# Patient Record
Sex: Male | Born: 1945 | Race: White | Hispanic: No | Marital: Married | Smoking: Never smoker
Health system: Southern US, Community
[De-identification: ages and names within clinical notes are randomized; demographics above are authoritative.]

## PROBLEM LIST (undated history)

## (undated) DIAGNOSIS — H269 Unspecified cataract: Secondary | ICD-10-CM

## (undated) DIAGNOSIS — E785 Hyperlipidemia, unspecified: Secondary | ICD-10-CM

## (undated) DIAGNOSIS — T7840XA Allergy, unspecified, initial encounter: Secondary | ICD-10-CM

## (undated) HISTORY — DX: Unspecified cataract: H26.9

## (undated) HISTORY — PX: OTHER SURGICAL HISTORY: SHX169

## (undated) HISTORY — DX: Hyperlipidemia, unspecified: E78.5

## (undated) HISTORY — DX: Allergy, unspecified, initial encounter: T78.40XA

---

## 2011-11-10 ENCOUNTER — Ambulatory Visit: Payer: Self-pay | Admitting: Internal Medicine

## 2011-12-08 ENCOUNTER — Ambulatory Visit: Payer: Self-pay | Admitting: Internal Medicine

## 2011-12-22 ENCOUNTER — Ambulatory Visit (INDEPENDENT_AMBULATORY_CARE_PROVIDER_SITE_OTHER): Payer: Federal, State, Local not specified - PPO | Admitting: Internal Medicine

## 2011-12-22 DIAGNOSIS — E782 Mixed hyperlipidemia: Secondary | ICD-10-CM

## 2011-12-22 DIAGNOSIS — J301 Allergic rhinitis due to pollen: Secondary | ICD-10-CM

## 2011-12-22 DIAGNOSIS — R05 Cough: Secondary | ICD-10-CM

## 2012-03-08 ENCOUNTER — Ambulatory Visit (INDEPENDENT_AMBULATORY_CARE_PROVIDER_SITE_OTHER): Payer: Federal, State, Local not specified - PPO | Admitting: Internal Medicine

## 2012-03-08 ENCOUNTER — Encounter: Payer: Self-pay | Admitting: Internal Medicine

## 2012-03-08 VITALS — BP 149/86 | HR 63 | Temp 98.9°F | Resp 16 | Ht 70.0 in | Wt 184.0 lb

## 2012-03-08 DIAGNOSIS — R05 Cough: Secondary | ICD-10-CM

## 2012-03-08 DIAGNOSIS — R059 Cough, unspecified: Secondary | ICD-10-CM

## 2012-03-08 DIAGNOSIS — J40 Bronchitis, not specified as acute or chronic: Secondary | ICD-10-CM

## 2012-03-08 DIAGNOSIS — J9801 Acute bronchospasm: Secondary | ICD-10-CM

## 2012-03-08 DIAGNOSIS — J329 Chronic sinusitis, unspecified: Secondary | ICD-10-CM

## 2012-03-08 DIAGNOSIS — Z789 Other specified health status: Secondary | ICD-10-CM | POA: Insufficient documentation

## 2012-03-08 DIAGNOSIS — J301 Allergic rhinitis due to pollen: Secondary | ICD-10-CM

## 2012-03-08 MED ORDER — METHYLPREDNISOLONE ACETATE 80 MG/ML IJ SUSP
80.0000 mg | Freq: Once | INTRAMUSCULAR | Status: AC
  Administered 2012-03-08: 80 mg via INTRAMUSCULAR

## 2012-03-08 MED ORDER — AZITHROMYCIN 500 MG PO TABS: 500.0000 mg | ORAL_TABLET | Freq: Every day | ORAL | Status: AC

## 2012-03-08 NOTE — Progress Notes (Signed)
  Subjective:    Patient ID: Austin Watkins, male    DOB: [birth year], 66 y.o.   MRN: [id number]  HPI Has cough, wheezes mild, allergy congestions. Has bad breath per wife Allergys much better on our rx. Review of Systems     Objective:   Physical Exam Nasal congestion Lungs a few wheezes   PFR 625 normal    Assessment & Plan:  Allergys and mild bronchospasm Sinusitis and bronchitis Depomedrol 120mg  IM Zithromax 500mg  5d

## 2012-03-22 ENCOUNTER — Encounter: Payer: Self-pay | Admitting: Internal Medicine

## 2012-03-29 ENCOUNTER — Ambulatory Visit (INDEPENDENT_AMBULATORY_CARE_PROVIDER_SITE_OTHER): Payer: Federal, State, Local not specified - PPO | Admitting: Internal Medicine

## 2012-03-29 ENCOUNTER — Encounter: Payer: Self-pay | Admitting: Internal Medicine

## 2012-03-29 VITALS — BP 158/86 | HR 60 | Temp 97.2°F | Resp 16 | Ht 69.5 in | Wt 179.2 lb

## 2012-03-29 DIAGNOSIS — E782 Mixed hyperlipidemia: Secondary | ICD-10-CM

## 2012-03-29 DIAGNOSIS — E789 Disorder of lipoprotein metabolism, unspecified: Secondary | ICD-10-CM

## 2012-03-29 DIAGNOSIS — J301 Allergic rhinitis due to pollen: Secondary | ICD-10-CM

## 2012-03-29 DIAGNOSIS — Z0000 Encounter for general adult medical examination without abnormal findings: Secondary | ICD-10-CM

## 2012-03-29 MED ORDER — ATORVASTATIN CALCIUM 10 MG PO TABS: 10.0000 mg | ORAL_TABLET | Freq: Every day | ORAL | Status: DC

## 2012-03-29 NOTE — Patient Instructions (Signed)
Allergic Rhinitis  Allergic rhinitis is when the mucous membranes in the nose respond to allergens. Allergens are particles in the air that cause your body to have an allergic reaction. This causes you to release allergic antibodies. Through a chain of events, these eventually cause you to release histamine into the blood stream (hence the use of antihistamines). Although meant to be protective to the body, it is this release that causes your discomfort, such as frequent sneezing, congestion and an itchy runny nose.    CAUSES    The pollen allergens may come from grasses, trees, and weeds. This is seasonal allergic rhinitis, or "hay fever." Other allergens cause year-round allergic rhinitis (perennial allergic rhinitis) such as house dust mite allergen, pet dander and mold spores.    SYMPTOMS     Nasal stuffiness (congestion).   Runny, itchy nose with sneezing and tearing of the eyes.   There is often an itching of the mouth, eyes and ears.  It cannot be cured, but it can be controlled with medications.  DIAGNOSIS    If you are unable to determine the offending allergen, skin or blood testing may find it.  TREATMENT     Avoid the allergen.   Medications and allergy shots (immunotherapy) can help.   Hay fever may often be treated with antihistamines in pill or nasal spray forms. Antihistamines block the effects of histamine. There are over-the-counter medicines that may help with nasal congestion and swelling around the eyes. Check with your caregiver before taking or giving this medicine.  If the treatment above does not work, there are many new medications your caregiver can prescribe. Stronger medications may be used if initial measures are ineffective. Desensitizing injections can be used if medications and avoidance fails. Desensitization is when a patient is given ongoing shots until the body becomes less sensitive to the allergen. Make sure you follow up with your caregiver if problems continue.  SEEK  MEDICAL CARE IF:     You develop fever (more than 100.5 F (38.1 C).   You develop a cough that does not stop easily (persistent).   You have shortness of breath.   You start wheezing.   Symptoms interfere with normal daily activities.  Document Released: 08/12/2001 Document Revised: 11/06/2011 Document Reviewed: 02/21/2009  [employer] Patient Information 2012 [employer].

## 2012-03-29 NOTE — Progress Notes (Signed)
  Subjective:    Patient ID: Austin Watkins, male    DOB: 1946-03-04, 66 y.o.   MRN: [id number]  HPI allergys and bronchospasm much better Lipids controlled and tolerates med See scanned hx Review of Systems see scanned ROS   Objective:   Physical Exam  Constitutional: He is oriented to person, place, and time. He appears well-developed and well-nourished.  HENT:  Right Ear: External ear normal.  Left Ear: External ear normal.  Nose: Nose normal.  Mouth/Throat: Oropharynx is clear and moist.  Eyes: Conjunctivae and EOM are normal. Pupils are equal, round, and reactive to light. No scleral icterus.  Neck: Neck supple. No JVD present. No tracheal deviation present. No thyromegaly present.  Cardiovascular: Normal rate, regular rhythm, normal heart sounds and intact distal pulses.   Pulmonary/Chest: Breath sounds normal.  Abdominal: Soft. Bowel sounds are normal. He exhibits no mass. There is no tenderness.  Genitourinary: Rectum normal and penis normal.  Musculoskeletal: Normal range of motion.  Lymphadenopathy:    He has no cervical adenopathy.  Neurological: He is alert and oriented to person, place, and time. He has normal reflexes. Coordination normal.  Skin: Skin is warm and dry.  Psychiatric: He has a normal mood and affect.   Tiny nodule on left lobe prostate       Assessment & Plan:  Healthy CPE RF allergy meds 1 year Watch on prostate

## 2012-04-06 ENCOUNTER — Telehealth: Payer: Self-pay

## 2012-04-06 NOTE — Telephone Encounter (Signed)
Wife concerned about abnormal lung findings at patient's recent physical with Dr. Perrin Maltese. How severe was damage? Does he need referred to pulmonary specialist? Also, he had rheumatic fever at age 66, so could that be damage that appears on lungs?  Also, patient got generic Lipitor from pharmacy but has always been on brand RX. Was he supposed to get generic, and will he tolerate it? Hasn't opened package from pharmacy yet. Requests call from Dr. Perrin Maltese or nurse. OK to leave message on voicemail if she can't answer.

## 2012-04-12 ENCOUNTER — Telehealth: Payer: Self-pay

## 2012-04-12 MED ORDER — LIPITOR 10 MG PO TABS: 10.0000 mg | ORAL_TABLET | Freq: Every day | ORAL | Status: DC

## 2012-04-12 NOTE — Telephone Encounter (Signed)
Patient's wife is very concerned about her husband, she left this message on 5/7 and not received a call back from [place]. I am re-routing msg to the clinical pool to try to assist her.

## 2012-04-12 NOTE — Telephone Encounter (Signed)
Spoke w/wife and apologized that she had not received a call earlier, but that Dr Perrin Maltese has not been in office. He will be back on Thurs and will review ?s about lung damage at that time. Assured her that if damage was very severe that Dr Perrin Maltese would have discussed sending pt to pulmonologist at the visit. Wife requests that we send Lipitor Rx for name brand only to pharm since it is working well for pt and they have not had a problem w/ins paying for it. I am changing Rx to Name Brand and sending to pharm. Dr Perrin Maltese, do you want to call pt or give me message for pt on her other ?s

## 2013-03-28 ENCOUNTER — Ambulatory Visit (INDEPENDENT_AMBULATORY_CARE_PROVIDER_SITE_OTHER): Payer: Federal, State, Local not specified - PPO | Admitting: Internal Medicine

## 2013-03-28 ENCOUNTER — Encounter: Payer: Self-pay | Admitting: Internal Medicine

## 2013-03-28 VITALS — BP 140/90 | HR 62 | Temp 98.1°F | Resp 16 | Ht 69.5 in | Wt 186.0 lb

## 2013-03-28 DIAGNOSIS — Z125 Encounter for screening for malignant neoplasm of prostate: Secondary | ICD-10-CM

## 2013-03-28 DIAGNOSIS — Z79899 Other long term (current) drug therapy: Secondary | ICD-10-CM

## 2013-03-28 DIAGNOSIS — J309 Allergic rhinitis, unspecified: Secondary | ICD-10-CM

## 2013-03-28 DIAGNOSIS — E785 Hyperlipidemia, unspecified: Secondary | ICD-10-CM

## 2013-03-28 DIAGNOSIS — J9801 Acute bronchospasm: Secondary | ICD-10-CM

## 2013-03-28 LAB — COMPREHENSIVE METABOLIC PANEL
ALT: 20 U/L (ref 0–53)
Albumin: 4.5 g/dL (ref 3.5–5.2)
BUN: 18 mg/dL (ref 6–23)
CO2: 29 mEq/L (ref 19–32)
Calcium: 9.5 mg/dL (ref 8.4–10.5)
Chloride: 104 mEq/L (ref 96–112)
Creat: 0.92 mg/dL (ref 0.50–1.35)
Potassium: 4.5 mEq/L (ref 3.5–5.3)

## 2013-03-28 LAB — CBC WITH DIFFERENTIAL/PLATELET
Basophils Absolute: 0 10*3/uL (ref 0.0–0.1)
HCT: 44.8 % (ref 39.0–52.0)
Hemoglobin: 15.6 g/dL (ref 13.0–17.0)
Lymphocytes Relative: 38 % (ref 12–46)
Monocytes Absolute: 0.7 10*3/uL (ref 0.1–1.0)
Neutro Abs: 3.8 10*3/uL (ref 1.7–7.7)
Neutrophils Relative %: 51 % (ref 43–77)
RDW: 13.5 % (ref 11.5–15.5)
WBC: 7.4 10*3/uL (ref 4.0–10.5)

## 2013-03-28 LAB — TSH: TSH: 2.021 u[IU]/mL (ref 0.350–4.500)

## 2013-03-28 LAB — LIPID PANEL
HDL: 52 mg/dL (ref >39)
LDL Cholesterol: 87 mg/dL (ref 0–99)
VLDL: 26 mg/dL (ref 0–40)

## 2013-03-28 MED ORDER — ATORVASTATIN CALCIUM 10 MG PO TABS: 10.0000 mg | ORAL_TABLET | Freq: Every day | ORAL | Status: DC

## 2013-03-28 NOTE — Patient Instructions (Signed)
Immunization Schedule, Adult  Influenza vaccine.  Adults should be given 1 dose every year.  Tetanus, diphtheria, and pertussis (Td, Tdap) vaccine.  Adults who have not previously been given Tdap or who do not know their vaccine status should be given 1 dose of Tdap.  Adults should have a Td booster every 10 years.  Doses should be given if needed to catch up on missed doses in the past.  Pregnant women should be given 1 dose of Tdap vaccine during each pregnancy.  Varicella vaccine.  All adults without evidence of immunity to varicella should receive 2 doses or a second dose if they have received only 1 dose.  Pregnant women who do not have evidence of immunity should be given the first dose after their pregnancy.  Human papillomavirus (HPV) vaccine.  Women aged 43 through 26 years who have not been given the vaccine previously should be given the 3 dose series. The second dose should be given 1 to 2 months after the first dose. The third dose should be given at least 24 weeks after the first dose.  The vaccine is not recommended for use in pregnant women. However, pregnancy testing is not needed before being given a dose. If a woman is found to be pregnant after being given a dose, no treatment is needed. In that case, the remaining doses should be delayed until after the pregnancy.  Men aged 9 through 21 years who have not been given the vaccine previously should be given the 3 dose series. Men aged 26 through 62 years may be given the 3 dose series. The second dose should be given 1 to 2 months after the first dose. The third dose should be given at least 24 weeks after the first dose.  Zoster vaccine.  One dose is recommended for adults aged 50 years and older unless certain conditions are present.  Measles, mumps, and rubella (MMR) vaccine.  Adults born before 3 generally are considered immune to measles and mumps. Healthcare workers born before 1957 who do not have  evidence of immunity should consider vaccination.  Adults born in [birth year] or later should have 1 or more doses of MMR vaccine unless there is a contraindication for the vaccine or they have evidence of immunity to the diseases. A second dose should be given at least 28 days after the first dose. Adults receiving certain types of previous vaccines should consider or be given vaccine doses.  For women of childbearing age, rubella immunity should be determined. If there is no evidence of immunity, women who are not pregnant should be vaccinated. If there is no evidence of immunity, women who are pregnant should delay vaccination until after their pregnancy.  Pneumococcal polysaccharide (PPSV23) vaccine.  All adults aged 3 years and older should be given 1 dose.  Adults younger than age 64 years who have certain medical conditions, who smoke cigarettes, who reside in nursing homes or long-term care facilities, or who have an unknown vaccination history should usually be given 1 or 2 doses of the vaccine.  Pneumococcal 13-valent conjugate (PVC13) vaccine.  Adults aged 46 years or older with certain medical conditions and an unknown or incomplete pneumococcal vaccination history should usually be given 1 dose of the vaccine. This dose may be in addition to a PPSV23 vaccine dose.  Meningococcal vaccine.  First-year college students up to age 57 years who are living in residence halls should be given a dose if they did not receive a dose on  or after their 16th birthday.  A dose should be given to microbiologists working with certain meningitis bacteria, military recruits, and people who travel to or live in countries with a high rate of meningitis.  One or 2 doses should be given to adults who have certain high-risk conditions.  Hepatitis A vaccine.  Adults who wish to be protected from this disease, who have certain high-risk conditions, who work with hepatitis A-infected animals, who work in  hepatitis A research labs, or who travel to or work in countries with a high rate of hepatitis A should be given the 2 dose series of the vaccine.  Adults who were previously unvaccinated and who anticipate close contact with an international adoptee during the first 60 days after arrival in the [place] from a country with a high rate of hepatitis A should be given the vaccine. The first dose of the 2 dose series should be given 2 or more weeks before the arrival of the adoptee.  Hepatitis B vaccine.  Adults who wish to be protected from this disease, who have certain high-risk conditions, who may be exposed to blood or other infectious body fluids, who are household contacts or sex partners of hepatitis B positive people, who are clients or workers in certain care facilities, or who travel to or work in countries with a high rate of hepatitis B should be given the 3 dose series of the vaccine. If you travel outside the [place], additional vaccines may be needed. The Centers for Disease Control and Prevention (CDC) provides information about the vaccines, medicines, and other measures necessary to prevent illness and injury during international travel. Visit the CDC website at http://www.church.org/ or call (800) CDC-INFO [(660) 608-7489]. You may also consult a travel clinic or your caregiver. Document Released: 02/07/2004 Document Revised: 02/09/2012 Document Reviewed: 01/02/2012 [facility] Patient Information 2013 [place], [place].

## 2013-03-28 NOTE — Progress Notes (Signed)
  Subjective:    Patient ID: Austin Watkins, male    DOB: 1946/08/05, 67 y.o.   MRN: [id number]  HPI Refuses immunizations today, offered shingles and pneumovax. Doing well on atoravastatin.   Review of Systems cpe August    Objective:   Physical Exam  Vitals reviewed. Constitutional: He is oriented to person, place, and time. He appears well-developed and well-nourished.  Cardiovascular: Normal rate, regular rhythm and normal heart sounds.   Pulmonary/Chest: Effort normal and breath sounds normal.  Neurological: He is alert and oriented to person, place, and time.  Psychiatric: He has a normal mood and affect.   138/88       Assessment & Plan:  RF atoravastatin

## 2013-04-04 ENCOUNTER — Encounter: Payer: Federal, State, Local not specified - PPO | Admitting: Internal Medicine

## 2013-07-18 ENCOUNTER — Ambulatory Visit (INDEPENDENT_AMBULATORY_CARE_PROVIDER_SITE_OTHER): Payer: Federal, State, Local not specified - PPO | Admitting: Internal Medicine

## 2013-07-18 ENCOUNTER — Encounter: Payer: Self-pay | Admitting: Internal Medicine

## 2013-07-18 VITALS — BP 132/88 | HR 62 | Temp 98.8°F | Resp 16 | Ht 69.0 in | Wt 182.0 lb

## 2013-07-18 DIAGNOSIS — Z139 Encounter for screening, unspecified: Secondary | ICD-10-CM

## 2013-07-18 DIAGNOSIS — E785 Hyperlipidemia, unspecified: Secondary | ICD-10-CM

## 2013-07-18 DIAGNOSIS — Z79899 Other long term (current) drug therapy: Secondary | ICD-10-CM

## 2013-07-18 DIAGNOSIS — Z0000 Encounter for general adult medical examination without abnormal findings: Secondary | ICD-10-CM

## 2013-07-18 LAB — POCT URINALYSIS DIPSTICK
Bilirubin, UA: NEGATIVE
Glucose, UA: NEGATIVE
Ketones, UA: NEGATIVE
Leukocytes, UA: NEGATIVE
Nitrite, UA: NEGATIVE

## 2013-07-18 LAB — POCT UA - MICROSCOPIC ONLY: Yeast, UA: NEGATIVE

## 2013-07-18 LAB — IFOBT (OCCULT BLOOD): IFOBT: NEGATIVE

## 2013-07-18 NOTE — Progress Notes (Signed)
  Subjective:    Patient ID: Austin Watkins, male    DOB: [birth year], 67 y.o.   MRN: [id number]  HPI Refuses all vaccines except flu shot. Offered again. Lipids are great, he feels good and has no problems. Aging parents are his biggest worry.   Review of Systems All neg every system    Objective:   Physical Exam  Vitals reviewed. Constitutional: He is oriented to person, place, and time. He appears well-developed and well-nourished.  HENT:  Right Ear: External ear normal.  Left Ear: External ear normal.  Nose: Nose normal.  Mouth/Throat: Oropharynx is clear and moist.  Eyes: Conjunctivae and EOM are normal. Pupils are equal, round, and reactive to light.  Neck: Normal range of motion. Neck supple. No tracheal deviation present. No thyromegaly present.  Cardiovascular: Normal rate, regular rhythm, normal heart sounds and intact distal pulses.   Pulmonary/Chest: Effort normal and breath sounds normal.  Abdominal: Soft. Bowel sounds are normal. He exhibits no mass. There is no tenderness.  Genitourinary: Rectum normal, prostate normal and penis normal.  Musculoskeletal: Normal range of motion.  Neurological: He is alert and oriented to person, place, and time. He has normal reflexes. No cranial nerve deficit. He exhibits normal muscle tone. Coordination normal.  Skin: Skin is warm and dry.  Psychiatric: He has a normal mood and affect. His behavior is normal. Judgment and thought content normal.   Results for orders placed in visit on 07/18/13  POCT UA - MICROSCOPIC ONLY      Result Value Range   WBC, Ur, HPF, POC 0-1     RBC, urine, microscopic 0-4     Bacteria, U Microscopic neg     Mucus, UA trace     Epithelial cells, urine per micros 0-2     Crystals, Ur, HPF, POC neg     Casts, Ur, LPF, POC neg     Yeast, UA neg    POCT URINALYSIS DIPSTICK      Result Value Range   Color, UA yellow     Clarity, UA clear     Glucose, UA neg     Bilirubin, UA neg     Ketones, UA neg      Spec Grav, UA 1.020     Blood, UA trace     pH, UA 5.5     Protein, UA neg     Urobilinogen, UA 0.2     Nitrite, UA neg     Leukocytes, UA Negative            Assessment & Plan:  RF meds 1 year.

## 2013-07-18 NOTE — Patient Instructions (Signed)
Cholesterol Cholesterol is a white, waxy, fat-like protein needed by your body in small amounts. The liver makes all the cholesterol you need. It is carried from the liver by the blood through the blood vessels. Deposits (plaque) may build up on blood vessel walls. This makes the arteries narrower and stiffer. Plaque increases the risk for heart attack and stroke. You cannot feel your cholesterol level even if it is very high. The only way to know is by a blood test to check your lipid (fats) levels. Once you know your cholesterol levels, you should keep a record of the test results. Work with your caregiver to to keep your levels in the desired range. WHAT THE RESULTS MEAN:  Total cholesterol is a rough measure of all the cholesterol in your blood.  LDL is the so-called bad cholesterol. This is the type that deposits cholesterol in the walls of the arteries. You want this level to be low.  HDL is the good cholesterol because it cleans the arteries and carries the LDL away. You want this level to be high.  Triglycerides are fat that the body can either burn for energy or store. High levels are closely linked to heart disease. DESIRED LEVELS:  Total cholesterol below 200.  LDL below 100 for people at risk, below 70 for very high risk.  HDL above 50 is good, above 60 is best.  Triglycerides below 150. HOW TO LOWER YOUR CHOLESTEROL:  Diet.  Choose fish or white meat chicken and [place], roasted or baked. Limit fatty cuts of red meat, fried foods, and processed meats, such as sausage and lunch meat.  Eat lots of fresh fruits and vegetables. Choose whole grains, beans, pasta, potatoes and cereals.  Use only small amounts of olive, corn or canola oils. Avoid butter, mayonnaise, shortening or palm kernel oils. Avoid foods with trans-fats.  Use skim/nonfat milk and low-fat/nonfat yogurt and cheeses. Avoid whole milk, cream, ice cream, egg yolks and cheeses. Healthy desserts include angel food  cake, ginger snaps, animal crackers, hard candy, popsicles, and low-fat/nonfat frozen yogurt. Avoid pastries, cakes, pies and cookies.  Exercise.  A regular program helps decrease LDL and raises HDL.  Helps with weight control.  Do things that increase your activity level like gardening, walking, or taking the stairs.  Medication.  May be prescribed by your caregiver to help lowering cholesterol and the risk for heart disease.  You may need medicine even if your levels are normal if you have several risk factors. HOME CARE INSTRUCTIONS   Follow your diet and exercise programs as suggested by your caregiver.  Take medications as directed.  Have blood work done when your caregiver feels it is necessary. MAKE SURE YOU:   Understand these instructions.  Will watch your condition.  Will get help right away if you are not doing well or get worse. Document Released: 08/12/2001 Document Revised: 02/09/2012 Document Reviewed: 02/02/2008 [facility] Patient Information 2014 [place], [place]. Bronchospasm, Adult Bronchospasm means that there is a spasm or tightening of the airways going into the lungs. Because the airways go into a spasm and get smaller it makes breathing more difficult. For reasons not completely known, workings (functions) of the airways designed to protect the lungs become over active. This causes the airways to become more sensitive to:  Infection.  Weather.  Exercise.  Irritants.  Things that cause allergic reactions or allergies (allergens). Frequent coughing or respiratory episodes should be checked for the cause. This condition may be made worse by exercise.  CAUSES  Inflammation is often the cause of this condition. Allergy, viral respiratory infections, or irritants in the air often cause this problem. Allergic reactions produce immediate and delayed responses. Late reactions may produce more serious inflammation. This may lead to increased reactivity of the  airways. Sometimes this is inherited. Some common triggers are:  Allergies.  Infection commonly triggers attacks. Antibiotics are not helpful for viral infections and usually do not help with attacks of bronchospasm.  Exercise (running, etc.) can trigger an attack. Proper pre-exercise medications help most individuals participate in sports. Swimming is the least likely sport to cause problems.  Irritants (for example, pollution, cigarette smoke, strong odors, aerosol sprays, paint fumes, etc.) may trigger attacks. You cannot smoke and do not allow smoking in your home. This is absolutely necessary. Show this instruction to mates, relatives and significant others that may not agree with you.  Weather changes may cause lung problems but moving around trying to find an ideal climate does not seem to be overly helpful. Winds increase molds and pollens in the air. Rain refreshes the air by washing irritants out. Cold air may cause irritation.  Emotional problems do not cause lung problems but can trigger attacks. SYMPTOMS  Wheezing is the most common symptom. Frequent coughing (with or without exercise and or crying) and repeated respiratory infections are all early warning signs of bronchospasm. Chest tightness and shortness of breath are other symptoms. DIAGNOSIS  Early hidden bronchospasm may go for long periods of time without being detected. This is especially true if wheezing cannot be detected by your caregiver. Lung (pulmonary) function studies may help with diagnosis in these cases. HOME CARE INSTRUCTIONS   It is necessary to remain calm during an attack. Try to relax and breathe more slowly. During this time medications may be given. If any breathing problems seem to be getting worse and are unresponsive to treatment seek immediate medical care.  If you have severe breathing difficulty or have had a life threatening attack it is probably a good idea for you to learn how to give adrenaline  (epi-pen) or use an anaphylaxis kit. Your caregiver can help you with this. These are the same kits carried by people who have severe allergic reactions. This is especially important if you do not have readily accessible medical care.  With any severe breathing problems where epinephrine (adrenaline) has been given at home call 911 immediately as the delayed reaction may be even more severe. SEEK MEDICAL CARE IF:   There is wheezing and shortness of breath, even if medications are given to prevent attacks.  An oral temperature above 102 F (38.9 C) develops.  There are muscle aches, chest pain, or thickening of sputum.  The sputum changes from clear or white to yellow, green, gray, or bloody.  There are problems that may be related to the medicine you are given, such as a rash, itching, swelling, or trouble breathing. SEEK IMMEDIATE MEDICAL CARE IF:   The usual medicines do not stop your wheezing, or there is increased coughing.  You have increased difficulty breathing. MAKE SURE YOU:   Understand these instructions.  Will watch your condition.  Will get help right away if you are not doing well or get worse. Document Released: 11/20/2003 Document Revised: 02/09/2012 Document Reviewed: 07/05/2008 [facility] Patient Information 2014 [place], [place].

## 2013-07-18 NOTE — Progress Notes (Signed)
  Subjective:    Patient ID: Austin Watkins, male    DOB: 08-04-1946, 67 y.o.   MRN: [id number]  HPI    Review of Systems  Constitutional: Negative.   HENT: Negative.   Eyes: Negative.   Respiratory: Negative.   Cardiovascular: Negative.   Gastrointestinal: Negative.   Endocrine: Negative.   Genitourinary: Negative.   Musculoskeletal: Negative.   Skin: Negative.   Allergic/Immunologic: Negative.   Neurological: Negative.   Hematological: Negative.   Psychiatric/Behavioral: Negative.        Objective:   Physical Exam        Assessment & Plan:

## 2014-03-13 ENCOUNTER — Ambulatory Visit (INDEPENDENT_AMBULATORY_CARE_PROVIDER_SITE_OTHER): Payer: Federal, State, Local not specified - PPO | Admitting: Family Medicine

## 2014-03-13 ENCOUNTER — Encounter: Payer: Self-pay | Admitting: Family Medicine

## 2014-03-13 ENCOUNTER — Encounter: Payer: Federal, State, Local not specified - PPO | Admitting: Family Medicine

## 2014-03-13 VITALS — BP 140/80 | HR 69 | Temp 98.7°F | Resp 16 | Ht 69.5 in | Wt 185.6 lb

## 2014-03-13 DIAGNOSIS — E7800 Pure hypercholesterolemia, unspecified: Secondary | ICD-10-CM

## 2014-03-13 DIAGNOSIS — J309 Allergic rhinitis, unspecified: Secondary | ICD-10-CM

## 2014-03-13 DIAGNOSIS — Z79899 Other long term (current) drug therapy: Secondary | ICD-10-CM

## 2014-03-13 DIAGNOSIS — E785 Hyperlipidemia, unspecified: Secondary | ICD-10-CM

## 2014-03-13 MED ORDER — ATORVASTATIN CALCIUM 10 MG PO TABS: 10.0000 mg | ORAL_TABLET | Freq: Every day | ORAL | Status: DC

## 2014-03-13 NOTE — Patient Instructions (Signed)
Same dose of lipitor at this point. You should receive a call or letter about your lab results within the next week to 10 days.  claritin or flonase nasal spray if needed for allergies. Return to the clinic or go to the nearest emergency room if any of your symptoms worsen or new symptoms occur.

## 2014-03-13 NOTE — Progress Notes (Signed)
Subjective:    Patient ID: Austin Watkins, male    DOB: 04/04/1946, 68 y.o.   MRN: [id number]  This chart was scribed for Wendie Agreste, MD by Maree Erie, ED Scribe.  Chief Complaint  Patient presents with  . Hyperlipidemia    bloodwork this am for Chol  . Medication Refill    Atorvastatin 10 mg    PCP: Kennon Portela, MD   HPI  Austin Watkins is a 68 y.o. male who presents to office as a new patient to me. Former patient of Dr. Elder Cyphers, last seen August 2014.   Hyperlipidemia: Here for refill of medications for hyperlipidemia. Takes Lipitor 10 mg QD. He denies side effects, including myalgias, abdominal pain, chest pain or palpitations. He has been on the medication "for years." He denies a history of MI or stent placement. He denies a family history of heart disease. He does not smoke. He has not been walking for exercise as much lately. He reports baseline shortness of breath with walking up stairs that he attributes to not exercising. He is able to mow the lawn and do yard work without difficulty. He also reports a history of rheumatic fever at a young age.   Last lipid panel in 03/2013, seen below: Lab Results  Component Value Date   CHOL 165 03/28/2013   HDL 52 03/28/2013   LDLCALC 87 03/28/2013   TRIG 132 03/28/2013   CHOLHDL 3.2 03/28/2013   Last CMP, normal at that time.   Blood Pressure: He states that he checks his BP at home and its usually in the 761P systolic. He states he usually has "white coat hypertension" and his BP always goes up in the office.   Seasonal Allergies: His allergies have flared up for the season but he has an inhaler and nasal spray for use as needed. He is taking an OTC antihistamine medication for the allergies every four hours.   Patient Active Problem List   Diagnosis Date Noted  . Lipid disorder 03/29/2012  . Allergy history unknown 03/08/2012  . Bronchospasm 03/08/2012   Past Medical History  Diagnosis Date  . Allergy   .  Hyperlipidemia    Past Surgical History  Procedure Laterality Date  . Knee scoped     Allergies  Allergen Reactions  . Neosporin [Neomycin-Bacitracin Zn-Polymyx] Other (See Comments)    Triple antibiotic - rash on skin   Prior to Admission medications   Medication Sig Start Date End Date Taking? Authorizing Provider  aspirin 81 MG tablet Take 81 mg by mouth daily.   Yes Historical Provider, MD  atorvastatin (LIPITOR) 10 MG tablet Take 1 tablet (10 mg total) by mouth daily. 03/28/13  Yes Orma Flaming, MD  OVER THE COUNTER MEDICATION OTC [facility] taking 1 tab in the am/1 at night for cough and drainage   Yes Historical Provider, MD  albuterol (PROVENTIL HFA;VENTOLIN HFA) 108 (90 BASE) MCG/ACT inhaler Inhale 2 puffs into the lungs every 6 (six) hours as needed.    Historical Provider, MD  cetirizine (ZYRTEC) 10 MG tablet Take 10 mg by mouth daily.    Historical Provider, MD  fluticasone (FLONASE) 50 MCG/ACT nasal spray Place 2 sprays into the nose daily.    Historical Provider, MD   History   Social History  . Marital Status: Married    Spouse Name: N/A    Number of Children: N/A  . Years of Education: N/A   Occupational History  . Not on  file.   Social History Main Topics  . Smoking status: Never Smoker   . Smokeless tobacco: Not on file  . Alcohol Use: No  . Drug Use: No  . Sexual Activity: Not on file   Other Topics Concern  . Not on file   Social History Narrative  . No narrative on file     Review of Systems  Constitutional: Negative for fatigue and unexpected weight change.  Eyes: Negative for visual disturbance.  Respiratory: Positive for shortness of breath. Negative for cough and chest tightness.   Cardiovascular: Negative for chest pain, palpitations and leg swelling.  Gastrointestinal: Negative for abdominal pain and blood in stool.  Musculoskeletal: Negative for myalgias.  Neurological: Negative for dizziness, light-headedness and headaches.         Objective:   Physical Exam  Nursing note and vitals reviewed. Constitutional: He is oriented to person, place, and time. He appears well-developed and well-nourished.  HENT:  Head: Normocephalic and atraumatic.  Eyes: EOM are normal. Pupils are equal, round, and reactive to light.  Neck: No JVD present. Carotid bruit is not present.  Cardiovascular: Normal rate, regular rhythm and normal heart sounds.   No murmur heard. Pulmonary/Chest: Effort normal and breath sounds normal. He has no rales.  Musculoskeletal: He exhibits no edema.  Neurological: He is alert and oriented to person, place, and time.  Skin: Skin is warm and dry.  Psychiatric: He has a normal mood and affect.    Filed Vitals:   03/13/14 1452  BP: 140/80  Pulse: 69  Temp: 98.7 F (37.1 C)  TempSrc: Oral  Resp: 16  Height: 5' 9.5" (1.765 m)  Weight: 185 lb 9.6 oz (84.188 kg)  SpO2: 99%         Assessment & Plan:   KATHLEEN LIKINS is a 68 y.o. male  Other and unspecified hyperlipidemia - Plan: atorvastatin (LIPITOR) 10 MG tablet, COMPLETE METABOLIC PANEL WITH GFR, Lipid panel - refilled at same dose, at moderate dose statin per NCEP guidelines. Lipids, cmp pending.   Allergic rhinitis - has zyrtec, flonase if needed.   Meds ordered this encounter  Medications  . OVER THE COUNTER MEDICATION    Sig: OTC Wal-Finite taking 1 tab in the am/1 at night for cough and drainage  . atorvastatin (LIPITOR) 10 MG tablet    Sig: Take 1 tablet (10 mg total) by mouth daily.    Dispense:  90 tablet    Refill:  1   Patient Instructions  Same dose of lipitor at this point. You should receive a call or letter about your lab results within the next week to 10 days.  claritin or flonase nasal spray if needed for allergies. Return to the clinic or go to the nearest emergency room if any of your symptoms worsen or new symptoms occur.   I personally performed the services described in this documentation, which was scribed in  my presence. The recorded information has been reviewed and considered, and addended by me as needed.

## 2014-03-14 LAB — COMPLETE METABOLIC PANEL WITH GFR
ALBUMIN: 4.3 g/dL (ref 3.5–5.2)
ALK PHOS: 61 U/L (ref 39–117)
ALT: 21 U/L (ref 0–53)
AST: 20 U/L (ref 0–37)
BILIRUBIN TOTAL: 0.9 mg/dL (ref 0.2–1.2)
BUN: 17 mg/dL (ref 6–23)
CO2: 28 mEq/L (ref 19–32)
CREATININE: 0.81 mg/dL (ref 0.50–1.35)
Calcium: 8.9 mg/dL (ref 8.4–10.5)
Chloride: 103 mEq/L (ref 96–112)
GFR, Est African American: >89 mL/min
GLUCOSE: 84 mg/dL (ref 70–99)
Potassium: 4.4 mEq/L (ref 3.5–5.3)
Sodium: 138 mEq/L (ref 135–145)
Total Protein: 6.6 g/dL (ref 6.0–8.3)

## 2014-03-14 LAB — LIPID PANEL
CHOL/HDL RATIO: 3.3 ratio
CHOLESTEROL: 154 mg/dL (ref 0–200)
HDL: 46 mg/dL (ref >39)
LDL Cholesterol: 91 mg/dL (ref 0–99)
TRIGLYCERIDES: 85 mg/dL (ref <150)
VLDL: 17 mg/dL (ref 0–40)

## 2014-04-18 ENCOUNTER — Emergency Department
Admission: EM | Admit: 2014-04-18 | Discharge: 2014-04-18 | Disposition: A | Discharge status: Home / Self Care | Payer: BC Managed Care – PPO | Source: Home / Self Care | Attending: Emergency Medicine | Admitting: Emergency Medicine

## 2014-04-18 ENCOUNTER — Ambulatory Visit (INDEPENDENT_AMBULATORY_CARE_PROVIDER_SITE_OTHER): Payer: BC Managed Care – PPO | Admitting: Sports Medicine

## 2014-04-18 ENCOUNTER — Emergency Department (INDEPENDENT_AMBULATORY_CARE_PROVIDER_SITE_OTHER): Payer: BC Managed Care – PPO

## 2014-04-18 ENCOUNTER — Encounter: Payer: Self-pay | Admitting: Emergency Medicine

## 2014-04-18 DIAGNOSIS — S92919A Unspecified fracture of unspecified toe(s), initial encounter for closed fracture: Secondary | ICD-10-CM

## 2014-04-18 DIAGNOSIS — S92911A Unspecified fracture of right toe(s), initial encounter for closed fracture: Secondary | ICD-10-CM | POA: Insufficient documentation

## 2014-04-18 DIAGNOSIS — M7989 Other specified soft tissue disorders: Secondary | ICD-10-CM

## 2014-04-18 DIAGNOSIS — S92351A Displaced fracture of fifth metatarsal bone, right foot, initial encounter for closed fracture: Secondary | ICD-10-CM

## 2014-04-18 DIAGNOSIS — X58XXXA Exposure to other specified factors, initial encounter: Secondary | ICD-10-CM

## 2014-04-18 DIAGNOSIS — S92309A Fracture of unspecified metatarsal bone(s), unspecified foot, initial encounter for closed fracture: Secondary | ICD-10-CM

## 2014-04-18 NOTE — ED Notes (Signed)
Austin Watkins c/o rolling his right foot stepping off of an attic ladder yesterday. Swelling and pain present.

## 2014-04-18 NOTE — Progress Notes (Signed)
   Subjective:    I'm seeing this patient as a consultation for:  Dr. Burnett Harry  CC: Multiple fractures  HPI: This is a pleasant 68 year old male, yesterday he was climbing down a ladder, misstep, falling on his right foot, inverting it. He had immediate pain, swelling, and inability to bear weight. He tried icing it home, with continued pain they brought him here, her x-ray showed multiple fractures. I was consulted for further evaluation and definitive treatment. Pain is severe, persistent.  Past medical history, Surgical history, Family history not pertinant except as noted below, Social history, Allergies, and medications have been entered into the medical record, reviewed, and no changes needed.   Review of Systems: No headache, visual changes, nausea, vomiting, diarrhea, constipation, dizziness, abdominal pain, skin rash, fevers, chills, night sweats, weight loss, swollen lymph nodes, body aches, joint swelling, muscle aches, chest pain, shortness of breath, mood changes, visual or auditory hallucinations.   Objective:   General: Well Developed, well nourished, and in no acute distress.  Neuro/Psych: Alert and oriented x3, extra-ocular muscles intact, able to move all 4 extremities, sensation grossly intact. Skin: Warm and dry, no rashes noted.  Respiratory: Not using accessory muscles, speaking in full sentences, trachea midline.  Cardiovascular: Pulses palpable, no extremity edema. Abdomen: Does not appear distended. Right foot: Exquisite pain at the base of the fifth metatarsal and the dorsal mid foot, he has very little pain at the metatarsal phalangeal joints. Neurovascularly intact distally.  X-ray shows nondisplaced fractures through the base of the second, third, and fifth proximal phalanges, as well as the base of the fifth metatarsal.  The foot was strapped with compressive dressing. A cast boot was placed.  Impression and Recommendations:   This case required medical  decision making of moderate complexity.

## 2014-04-18 NOTE — Assessment & Plan Note (Signed)
Cast boot. Strap with compressive dressing. Return in 2 weeks, x-ray before visit.  I billed a fracture code for this visit, all subsequent visits for this complaint will be "post-op checks" in the global period.

## 2014-04-18 NOTE — ED Provider Notes (Signed)
CSN: [id number]     Arrival date & time 04/18/14  1244 History   First MD Initiated Contact with Patient 04/18/14 1310     Chief Complaint  Patient presents with  . Foot Injury   (Consider location/radiation/quality/duration/timing/severity/associated sxs/prior Treatment) HPI Here with wife. Yesterday, rolled right foot, accidentally striking right foot on stairs step. Now with severe right lateral foot pain and swelling. It aches. Pain 8/10. No focal weakness but pain exacerbated by movement of right foot. Denies right ankle pain. He's able to weight-bear, but walking exacerbates the pain. Tried ice and ibuprofen, which helped the pain somewhat. Denies prior problems with right foot. Past Medical History  Diagnosis Date  . Allergy   . Hyperlipidemia    Past Surgical History  Procedure Laterality Date  . Knee scoped     Family History  Problem Relation Age of Onset  . Heart disease Mother   . Thyroid disease Mother   . Heart disease Father   . Stroke Father   . Diabetes Brother    History  Substance Use Topics  . Smoking status: Never Smoker   . Smokeless tobacco: Never Used  . Alcohol Use: No    Review of Systems  Allergies  Neosporin  Home Medications   Prior to Admission medications   Medication Sig Start Date End Date Taking? Authorizing Provider  albuterol (PROVENTIL HFA;VENTOLIN HFA) 108 (90 BASE) MCG/ACT inhaler Inhale 2 puffs into the lungs every 6 (six) hours as needed.    Historical Provider, MD  aspirin 81 MG tablet Take 81 mg by mouth daily.    Historical Provider, MD  atorvastatin (LIPITOR) 10 MG tablet Take 1 tablet (10 mg total) by mouth daily. 03/13/14   Wendie Agreste, MD  cetirizine (ZYRTEC) 10 MG tablet Take 10 mg by mouth daily.    Historical Provider, MD  fluticasone (FLONASE) 50 MCG/ACT nasal spray Place 2 sprays into the nose daily.    Historical Provider, MD   BP 153/85  Pulse 69  Resp 14  Ht 5\' 10"  (1.778 m)  Wt 184 lb (83.462 kg)   BMI 26.40 kg/m2  SpO2 99% Physical Exam  Nursing note and vitals reviewed. Constitutional: He is oriented to person, place, and time. He appears well-developed and well-nourished. No distress.  Uncomfortable from right foot pain  HENT:  Head: Normocephalic and atraumatic.  Eyes: Conjunctivae and EOM are normal. Pupils are equal, round, and reactive to light. No scleral icterus.  Neck: Normal range of motion.  Cardiovascular: Normal rate.   Pulmonary/Chest: Effort normal.  Abdominal: He exhibits no distension.  Musculoskeletal:       Right ankle: Normal. He exhibits normal range of motion and no swelling. No tenderness.       Right foot: He exhibits decreased range of motion, tenderness, bony tenderness and swelling. He exhibits normal capillary refill and no laceration.       Feet:  There is 4+ swelling, tenderness, mild ecchymosis right lateral midfoot, especially over fourth and fifth metatarsals. Neurovascular distally intact.  Neurological: He is alert and oriented to person, place, and time.  Skin: Skin is warm.  Psychiatric: He has a normal mood and affect.    ED Course  Procedures (including critical care time) Labs Review Labs Reviewed - No data to display  Imaging Review Dg Foot Complete Right  04/18/2014   CLINICAL DATA:  Foot pain status post trauma  EXAM: RIGHT FOOT COMPLETE - 3+ VIEW  COMPARISON:  None.  FINDINGS: The  patient has sustained acute intraarticular fractures through the bases of the proximal phalanges of the second, third, and fifth digits. The bases of the first and fourth phalanges appear intact.There is also a transversely oriented mildly distracted fracture through the base of the fifth metatarsal. The remainder of the metatarsals appear intact. The bones of the hindfoot appear intact. There is mild soft tissue swelling over the dorsum of the midfoot.  IMPRESSION: The patient has sustained fractures through the bases of the proximal phalanges of the second  and third and fifth digits. These fractures are intra-articular. There is a transversely oriented fracture through the base of the fifth metatarsal.   Electronically Signed   By: David  Martinique   On: 04/18/2014 13:24     MDM   1. Closed displaced fracture of fifth metatarsal bone of right foot   and 3 intra-articular fractures through the bases of proximal phalanges of the second, third, and fifth digits of right foot. I contacted and discussed with Dr. Dianah Field, sports medicine specialist. We arranged for patient to see Dr. Dianah Field at his office within the next 30 minutes, for a definitive treatment. Patient and wife agree    Jacqulyn Cane, MD 04/18/14 [phone]

## 2014-04-18 NOTE — Assessment & Plan Note (Signed)
Cast boot. Strap with compressive dressing. Return in 2 weeks, x-ray before visit.  I billed a fracture code for this visit, all subsequent visits for this complaint will be "post-op checks" in the global period.  

## 2014-04-21 ENCOUNTER — Telehealth: Payer: Self-pay

## 2014-04-21 MED ORDER — MUPIROCIN 2 % EX OINT: TOPICAL_OINTMENT | CUTANEOUS | Status: DC

## 2014-04-21 NOTE — Telephone Encounter (Signed)
Patient spouse called stated that he was seen in Urgent Care for fracture of foot and it was wrapped with ace wrap. She reported that patient has a breakdown of the heel and it is bleeding she said that she cannot use the antibiotic ointment due to patient being allergic to neosporin. She wants to know if you can call in a antibiotic  Ointment that does not have neosporin in it and patient scheduled appt to come in 04/25/2014 to follow up on that. Amanpreet Delmont,CMA

## 2014-04-21 NOTE — Telephone Encounter (Signed)
I'll add topical mupirocin.

## 2014-04-21 NOTE — Telephone Encounter (Signed)
Left message on vm

## 2014-04-22 ENCOUNTER — Telehealth: Payer: Self-pay | Admitting: *Deleted

## 2014-04-22 NOTE — Telephone Encounter (Signed)
Message copied by Lucas Mallow on Sat Apr 22, 2014  1:30 PM ------      Message from: Theone Murdoch A      Created: Sat Apr 22, 2014  1:17 PM      Regarding: RE: pain meds       Please call in Tramadol 50mg ; one tab PO Q4 - 6hr prn pain; #15, no refill.  Thanks      ----- Message -----         From: Lucas Mallow, CMA         Sent: 04/22/2014  12:55 PM           To: Kandra Nicolas, MD      Subject: pain meds                                                Called pt.  He would like pain meds sent to [facility] [address] in [place].        ------

## 2014-04-25 ENCOUNTER — Ambulatory Visit (INDEPENDENT_AMBULATORY_CARE_PROVIDER_SITE_OTHER): Payer: BC Managed Care – PPO | Admitting: Sports Medicine

## 2014-04-25 ENCOUNTER — Encounter: Payer: Self-pay | Admitting: Sports Medicine

## 2014-04-25 VITALS — BP 151/93 | HR 79 | Ht 70.0 in | Wt 188.0 lb

## 2014-04-25 DIAGNOSIS — S92351A Displaced fracture of fifth metatarsal bone, right foot, initial encounter for closed fracture: Secondary | ICD-10-CM

## 2014-04-25 DIAGNOSIS — L02619 Cutaneous abscess of unspecified foot: Secondary | ICD-10-CM

## 2014-04-25 DIAGNOSIS — S92911A Unspecified fracture of right toe(s), initial encounter for closed fracture: Secondary | ICD-10-CM

## 2014-04-25 DIAGNOSIS — L03119 Cellulitis of unspecified part of limb: Secondary | ICD-10-CM

## 2014-04-25 DIAGNOSIS — S92919A Unspecified fracture of unspecified toe(s), initial encounter for closed fracture: Secondary | ICD-10-CM

## 2014-04-25 DIAGNOSIS — L03115 Cellulitis of right lower limb: Secondary | ICD-10-CM | POA: Insufficient documentation

## 2014-04-25 DIAGNOSIS — S92309A Fracture of unspecified metatarsal bone(s), unspecified foot, initial encounter for closed fracture: Secondary | ICD-10-CM

## 2014-04-25 MED ORDER — DOXYCYCLINE HYCLATE 100 MG PO TABS: 100.0000 mg | ORAL_TABLET | Freq: Two times a day (BID) | ORAL | Status: DC

## 2014-04-25 NOTE — Progress Notes (Signed)
  Subjective: One week post fractures of the fifth metatarsal, second, third, and fourth proximal phalanges of the right foot. Overall doing extremely well but he has noted some increasing redness and pain over the dorsum of his right foot. No constitutional symptoms.   Objective: General: Well-developed, well-nourished, and in no acute distress. Right foot: Swollen and bruised, he does have some erythema and induration of the dorsum of the foot. Neurovascularly intact distally.  Foot was strapped with compressive dressing.  Assessment/plan:

## 2014-04-25 NOTE — Assessment & Plan Note (Signed)
Doing well, continue cast boot, return to see me in one week, x-ray before visit.

## 2014-04-25 NOTE — Assessment & Plan Note (Signed)
Doxycycline. Return in a week.

## 2014-04-25 NOTE — Assessment & Plan Note (Signed)
Doing well, continue cast boot, return to see me in one week, x-ray before visit. 

## 2014-04-26 ENCOUNTER — Telehealth: Payer: Self-pay

## 2014-04-26 NOTE — Telephone Encounter (Signed)
I just gave him an oral antibiotic yesterday here in the office!  Give it more than 1 day!

## 2014-04-26 NOTE — Telephone Encounter (Signed)
Patient spouse called stated that patient was given antibiotic ointment for his heel and it is not working and the redness has increased, she wants to know what she can do at this point or maybe get a referral to a Orthopedic specialist. Oneta Rack

## 2014-04-27 NOTE — Telephone Encounter (Signed)
Left detailed message on spouse vm with instructions as noted below. Tiger Spieker,CMA

## 2014-05-01 ENCOUNTER — Ambulatory Visit: Payer: BC Managed Care – PPO | Admitting: Sports Medicine

## 2014-05-02 ENCOUNTER — Ambulatory Visit (INDEPENDENT_AMBULATORY_CARE_PROVIDER_SITE_OTHER): Payer: BC Managed Care – PPO

## 2014-05-02 ENCOUNTER — Encounter: Payer: Self-pay | Admitting: Sports Medicine

## 2014-05-02 ENCOUNTER — Ambulatory Visit (INDEPENDENT_AMBULATORY_CARE_PROVIDER_SITE_OTHER): Payer: BC Managed Care – PPO | Admitting: Sports Medicine

## 2014-05-02 VITALS — BP 150/90 | HR 68 | Ht 70.0 in | Wt 187.0 lb

## 2014-05-02 DIAGNOSIS — S92911A Unspecified fracture of right toe(s), initial encounter for closed fracture: Secondary | ICD-10-CM

## 2014-05-02 DIAGNOSIS — L03115 Cellulitis of right lower limb: Secondary | ICD-10-CM

## 2014-05-02 DIAGNOSIS — S92919A Unspecified fracture of unspecified toe(s), initial encounter for closed fracture: Secondary | ICD-10-CM

## 2014-05-02 DIAGNOSIS — IMO0001 Reserved for inherently not codable concepts without codable children: Secondary | ICD-10-CM

## 2014-05-02 DIAGNOSIS — L03119 Cellulitis of unspecified part of limb: Secondary | ICD-10-CM

## 2014-05-02 DIAGNOSIS — S92351A Displaced fracture of fifth metatarsal bone, right foot, initial encounter for closed fracture: Secondary | ICD-10-CM

## 2014-05-02 DIAGNOSIS — S92309A Fracture of unspecified metatarsal bone(s), unspecified foot, initial encounter for closed fracture: Secondary | ICD-10-CM

## 2014-05-02 DIAGNOSIS — L02619 Cutaneous abscess of unspecified foot: Secondary | ICD-10-CM

## 2014-05-02 MED ORDER — TRAMADOL HCL 50 MG PO TABS: ORAL_TABLET | ORAL | Status: DC

## 2014-05-02 NOTE — Assessment & Plan Note (Signed)
Continue Cam Boot. They did make an appointment with Dr. Doran Durand with foot and ankle surgery which I think is appropriate.  Happy for him to take over care.

## 2014-05-02 NOTE — Assessment & Plan Note (Signed)
Continue Cam Boot. They did make an appointment with Dr. Hewitt with foot and ankle surgery which I think is appropriate.  Happy for him to take over care.  

## 2014-05-02 NOTE — Progress Notes (Signed)
  Subjective: Doing much better one week after closed fractures of the base of the fifth metatarsal as well as transverse fractures of the right second, third, and fifth proximal phalanges. He is pain-free with weightbearing in the cast boot. Would like some more tramadol.   Objective: General: Well-developed, well-nourished, and in no acute distress. Right foot: Swelling is improved significantly, some bruising is still present, only minimal tenderness over the base of the fifth metatarsal, also mildly tender over the fracture proximal phalanges no gross deformity. Some subjective hypoesthesia over the great toe but overall neurovascularly intact distally.  X-rays reviewed show stability of the fractures. Joint congruity of the fractures of the phalanges is maintained.  Foot was strapped with compressive dressing.  Assessment/plan:

## 2014-05-02 NOTE — Assessment & Plan Note (Signed)
Resolved with doxycycline  

## 2014-07-03 ENCOUNTER — Encounter: Payer: Self-pay | Admitting: Emergency Medicine

## 2014-07-03 ENCOUNTER — Emergency Department
Admission: EM | Admit: 2014-07-03 | Discharge: 2014-07-03 | Disposition: A | Discharge status: Home / Self Care | Payer: BC Managed Care – PPO | Source: Home / Self Care | Attending: Family Medicine | Admitting: Family Medicine

## 2014-07-03 DIAGNOSIS — L237 Allergic contact dermatitis due to plants, except food: Secondary | ICD-10-CM

## 2014-07-03 DIAGNOSIS — L255 Unspecified contact dermatitis due to plants, except food: Secondary | ICD-10-CM

## 2014-07-03 MED ORDER — PREDNISONE 5 MG PO KIT: PACK | ORAL | Status: DC

## 2014-07-03 NOTE — ED Notes (Signed)
Rash started Wed behind left ear now on hands, ankles, both arms

## 2014-07-03 NOTE — ED Provider Notes (Signed)
Austin Watkins is a 68 y.o. male who presents to Urgent Care today for poison oak dermatitis. Patient has developed a worsening rash occurring sporadically across his body for the past 5 days. The rash started after he was exposed to a later discovered was poison oak. In the past with situations like this is done well with prednisone. He denies any new medications fevers or chills nausea or vomiting. He has not tried any medications yet. He feels well otherwise.   Past Medical History  Diagnosis Date  . Allergy   . Hyperlipidemia    History  Substance Use Topics  . Smoking status: Never Smoker   . Smokeless tobacco: Never Used  . Alcohol Use: No   ROS as above Medications: No current facility-administered medications for this encounter.   Current Outpatient Prescriptions  Medication Sig Dispense Refill  . albuterol (PROVENTIL HFA;VENTOLIN HFA) 108 (90 BASE) MCG/ACT inhaler Inhale 2 puffs into the lungs every 6 (six) hours as needed.      Marland Kitchen aspirin 81 MG tablet Take 81 mg by mouth daily.      Marland Kitchen atorvastatin (LIPITOR) 10 MG tablet Take 1 tablet (10 mg total) by mouth daily.  90 tablet  1  . cetirizine (ZYRTEC) 10 MG tablet Take 10 mg by mouth daily.      . fluticasone (FLONASE) 50 MCG/ACT nasal spray Place 2 sprays into the nose daily.      . mupirocin ointment (BACTROBAN) 2 % Apply to affected area TID for 7 days.  30 g  3  . PredniSONE 5 MG KIT 12 day dosepack po  1 kit  0  . traMADol (ULTRAM) 50 MG tablet 1-2 tabs by mouth Q8 hours, maximum 6 tabs per day.  90 tablet  0    Exam:  BP 146/88  Pulse 66  Temp(Src) 97.4 F (36.3 C) (Oral)  Ht _0  (1.778 m)  Wt 185 lb (83.915 kg)  BMI 26.54 kg/m2  SpO2 100% Gen: Well NAD HEENT: EOMI,  MMM Lungs: Normal work of breathing. CTABL Heart: RRR no MRG Abd: NABS, Soft. Nondistended, Nontender Exts: Brisk capillary refill, warm and well perfused.  Skin: Scattered maculopapular erythematous rash with vesicles consistent with poison  ivy or poison oak dermatitis. Distribution is behind the left ear arms legs hands and feet.   No results found for this or any previous visit (from the past 24 hour(s)). No results found.  Assessment and Plan: 68 y.o. male with poison oak dermatitis. Prednisone Dosepak. Over-the-counter creams as needed. Followup with primary care provider as needed.  Discussed warning signs or symptoms. Please see discharge instructions. Patient expresses understanding.   This note was created using Systems analyst. Any transcription errors are unintended.    Gregor Hams, MD 07/03/14 1332

## 2014-07-03 NOTE — Discharge Instructions (Signed)
Thank you for coming in today. Take the prednisone for 12 days as directed Come back as needed Call or go to the emergency room if you get worse, have trouble breathing, have chest pains, or palpitations.   Poison [facility] is an inflammation of the skin (contact dermatitis). It is caused by contact with the allergens on the leaves of the oak (toxicodendron) plants. Depending on your sensitivity, the rash may consist simply of redness and itching, or it may also progress to blisters which may break open (rupture). These must be well cared for to prevent secondary germ (bacterial) infection as these infections can lead to scarring. The eyes may also get puffy. The puffiness is worst in the morning and gets better as the day progresses. Healing is best accomplished by keeping any open areas dry, clean, covered with a bandage, and covered with an antibacterial ointment if needed. Without secondary infection, this dermatitis usually heals without scarring within 2 to 3 weeks without treatment. HOME CARE INSTRUCTIONS When you have been exposed to poison oak, it is very important to thoroughly wash with soap and water as soon as the exposure has been discovered. You have about one half hour to remove the plant resin before it will cause the rash. This cleaning will quickly destroy the oil or antigen on the skin (the antigen is what causes the rash). Wash aggressively under the fingernails as any plant resin still there will continue to spread the rash. Do not rub skin vigorously when washing affected area. Poison oak cannot spread if no oil from the plant remains on your body. Rash that has progressed to weeping sores (lesions) will not spread the rash unless you have not washed thoroughly. It is also important to clean any clothes you have been wearing as they may carry active allergens which will spread the rash, even several days later. Avoidance of the plant in the future is the best measure. Poison oak  plants can be recognized by the number of leaves. Generally, poison oak has three leaves with flowering branches on a single stem. Diphenhydramine may be purchased over the counter and used as needed for itching. Do not drive with this medication if it makes you drowsy. Ask your caregiver about medication for children. SEEK IMMEDIATE MEDICAL CARE IF:   Open areas of the rash develop.  You notice redness extending beyond the area of the rash.  There is a pus like discharge.  There is increased pain.  Other signs of infection develop (such as fever). Document Released: 05/24/2003 Document Revised: 02/09/2012 Document Reviewed: 10/03/2009 [facility] Patient Information 2015 [place], [place]. This information is not intended to replace advice given to you by your health care provider. Make sure you discuss any questions you have with your health care provider.

## 2014-09-01 ENCOUNTER — Telehealth: Payer: Self-pay

## 2014-09-01 NOTE — Telephone Encounter (Signed)
Spoke to pt wife- pt will be in Monday to have labs drawn and then rtn for his appt at 2pm. He will discuss refills for one year.

## 2014-09-01 NOTE — Telephone Encounter (Signed)
Patients spouse called to ask if husband needs blood work done for rx refills? They were told that he only has to do this once a year to get refills for next month. They are being cautious because they are also trying to set up a cpe with Dr. Carlota Raspberry (pcp)  For Monday the 10/05. They want to know as soon as possible if he needs blood work for cpe and for rx refill before this coming Monday. Please call and advise pt.   [phone]

## 2014-09-04 ENCOUNTER — Ambulatory Visit (INDEPENDENT_AMBULATORY_CARE_PROVIDER_SITE_OTHER): Payer: Federal, State, Local not specified - PPO | Admitting: Family Medicine

## 2014-09-04 ENCOUNTER — Encounter: Payer: Self-pay | Admitting: Family Medicine

## 2014-09-04 VITALS — BP 144/96 | HR 70 | Temp 98.4°F | Resp 16 | Ht 69.5 in | Wt 183.2 lb

## 2014-09-04 DIAGNOSIS — Z0000 Encounter for general adult medical examination without abnormal findings: Secondary | ICD-10-CM

## 2014-09-04 DIAGNOSIS — Z125 Encounter for screening for malignant neoplasm of prostate: Secondary | ICD-10-CM

## 2014-09-04 DIAGNOSIS — Z130 Encounter for screening for diseases of the blood and blood-forming organs and certain disorders involving the immune mechanism: Secondary | ICD-10-CM

## 2014-09-04 DIAGNOSIS — Z1211 Encounter for screening for malignant neoplasm of colon: Secondary | ICD-10-CM

## 2014-09-04 DIAGNOSIS — E785 Hyperlipidemia, unspecified: Secondary | ICD-10-CM

## 2014-09-04 DIAGNOSIS — R55 Syncope and collapse: Secondary | ICD-10-CM

## 2014-09-04 DIAGNOSIS — D1720 Benign lipomatous neoplasm of skin and subcutaneous tissue of unspecified limb: Secondary | ICD-10-CM

## 2014-09-04 LAB — IFOBT (OCCULT BLOOD): IFOBT: NEGATIVE

## 2014-09-04 MED ORDER — ATORVASTATIN CALCIUM 10 MG PO TABS: 10.0000 mg | ORAL_TABLET | Freq: Every day | ORAL | Status: DC

## 2014-09-04 NOTE — Progress Notes (Addendum)
Subjective:    Patient ID: Austin Watkins, male    DOB: 1946/11/26, 68 y.o.   MRN: [id number] This chart was scribed for Wendie Agreste, MD by Cathie Hoops, ED Scribe. The patient was seen in Room 27. The patient's care was started at 2:51 PM.   09/04/2014  Annual Exam and Medication Refill   HPI HPI Comments: Austin Watkins is a 68 y.o. male who presents to the [facility] for annual exam and medication refill.  He is here for his complete physical exam today.  Last seen by me in April of this year. Noted at that visit, that he has elevated BP at doctor's offices only and it normally runs in the 120s at home.  Pt notes he experienced syncope while he was waiting for his blood work. He notes this has happened once previously  when he had blood work done. Pt denies dizziness, light-headedness, chest pain, or SOB. Pt denies this occurs when he stands up quickly.   Health Maintenance: 1.) Colon Cancer Screening Last done at 48 with repeat in 10 years. Pt states findings diverticulosis without diverticulitis attacks.  2.) Prostate Cancer Screening His PSA was normal at 1.48 in April 2014. Pt's father had prostate problem but denies it was prostate cancer. He would like to continue prostate screening.  3.) Immunizations Pneumonia- Refuses both Prevnar and Pneumovax at this time. Flu- Refuses at this time. Tdap- 2010 Zostavax- Pt has previously had one mild case of shingles. Refuses at this time. Hepatitis- Refuses at this time.  4.) Exercise Normal EKG in August 2014, rate of 58.  He notes doing some occasionaly yard work but denies participating in a regular exercise routine. Pt denies any  Father: 82 and had MI at 49. Mother: 92 with pacemaker. Both parents are still living.   5.) Dental Pt notes he goes routinely every six months.  6.) Opthalmology  Pt wears 0.25 reading glasses, and reports visiting opthalmology annually. He was last seen less than a  year ago.  7.) Advanced Directives Pt has a living will and that wife is aware of his wishes.  Hyperlipidemia he takes Lipitor 10 mg q.d. last checked 5 months ago, controlled.   Pt complains of mass on right wrist with no pain onset one year ago.  Lab Results  Component Value Date   CHOL 154 03/13/2014   HDL 46 03/13/2014   LDLCALC 91 03/13/2014   TRIG 85 03/13/2014   CHOLHDL 3.3 03/13/2014   Pt notes he still takes Lipitor daily, he denies any side effects at this time. He takes a baby aspirin daily.  Pt notes his BP normally runs 120s/80s at home.    Patient Active Problem List   Diagnosis Date Noted  . Cellulitis of foot, right 04/25/2014  . Closed fracture of fifth metatarsal bone of right foot 04/18/2014  . Closed fracture of the right second, third, and fifth proximal phalanges of the foot 04/18/2014  . Lipid disorder 03/29/2012  . Allergy history unknown 03/08/2012  . Bronchospasm 03/08/2012   Past Medical History  Diagnosis Date  . Allergy   . Hyperlipidemia    Past Surgical History  Procedure Laterality Date  . Knee scoped     Allergies  Allergen Reactions  . Neosporin [Neomycin-Bacitracin Zn-Polymyx] Other (See Comments)    Triple antibiotic - rash on skin   Prior to Admission medications   Medication Sig Start Date End Date Taking? Authorizing Provider  aspirin  81 MG tablet Take 81 mg by mouth daily.   Yes Historical Provider, MD  atorvastatin (LIPITOR) 10 MG tablet Take 1 tablet (10 mg total) by mouth daily. 03/13/14  Yes Wendie Agreste, MD  albuterol (PROVENTIL HFA;VENTOLIN HFA) 108 (90 BASE) MCG/ACT inhaler Inhale 2 puffs into the lungs every 6 (six) hours as needed.    Historical Provider, MD  cetirizine (ZYRTEC) 10 MG tablet Take 10 mg by mouth daily.    Historical Provider, MD  fluticasone (FLONASE) 50 MCG/ACT nasal spray Place 2 sprays into the nose daily.    Historical Provider, MD  mupirocin ointment (BACTROBAN) 2 % Apply to affected area TID for 7  days. 04/21/14   Silverio Decamp, MD  PredniSONE 5 MG KIT 12 day dosepack po 07/03/14   Gregor Hams, MD  traMADol (ULTRAM) 50 MG tablet 1-2 tabs by mouth Q8 hours, maximum 6 tabs per day. 05/02/14   Silverio Decamp, MD   History   Social History  . Marital Status: Married    Spouse Name: N/A    Number of Children: N/A  . Years of Education: N/A   Occupational History  . Not on file.   Social History Main Topics  . Smoking status: Never Smoker   . Smokeless tobacco: Never Used  . Alcohol Use: No  . Drug Use: No  . Sexual Activity: Not on file   Other Topics Concern  . Not on file   Social History Narrative  . No narrative on file     Review of Systems 13. ROS per pt survey. Negative unless noted above. 13 point review of systems per patient health survey noted.  Negative other than as indicated on reviewed nursing note.    Objective:  Physical Exam  Nursing note and vitals reviewed. Constitutional: He is oriented to person, place, and time. He appears well-developed and well-nourished.  HENT:  Head: Normocephalic and atraumatic.  Right Ear: External ear normal.  Left Ear: External ear normal.  Mouth/Throat: Oropharynx is clear and moist.  Eyes: Conjunctivae and EOM are normal. Pupils are equal, round, and reactive to light.  Neck: Normal range of motion. Neck supple. No JVD present. Carotid bruit is not present. No thyromegaly present.  Cardiovascular: Normal rate, regular rhythm, normal heart sounds and intact distal pulses.   No murmur heard. Pulmonary/Chest: Effort normal and breath sounds normal. No respiratory distress. He has no wheezes. He has no rales.  Abdominal: Soft. He exhibits no distension. There is no tenderness. Hernia confirmed negative in the right inguinal area and confirmed negative in the left inguinal area.  Genitourinary: Prostate normal.  Musculoskeletal: Normal range of motion. He exhibits no edema and no tenderness.  Lateral aspect  of right wrist, there is an approximate 2 mm mass. Non tender, no bony tenderness, no no erythema.   Lymphadenopathy:    He has no cervical adenopathy.  Neurological: He is alert and oriented to person, place, and time. He has normal reflexes.  Skin: Skin is warm and dry.  Psychiatric: He has a normal mood and affect. His behavior is normal.    Filed Vitals:   09/04/14 1357 09/04/14 1444  BP: 150/84 144/96  Pulse: 70   Temp: 98.4 F (36.9 C)   TempSrc: Oral   Resp: 16   Height: 5' 9.5" (1.765 m)   Weight: 183 lb 3.2 oz (83.099 kg)   SpO2: 97%      Visual Acuity Screening   Right eye Left  eye Both eyes  Without correction: 20/40 20/30 20/25  With correction:      Fall and depression screen scales both zero. No positive response.  Results for orders placed in visit on 09/04/14  IFOBT (OCCULT BLOOD)      Result Value Ref Range   IFOBT Negative       Assessment & Plan:  3:09 PM- Patient informed of current plan for treatment and evaluation and agrees with plan at this time.  Austin Watkins is a 68 y.o. male Annual physical exam - Plan: IFOBT POC (occult bld, rslt in office), CBC with Differential, COMPLETE METABOLIC PANEL WITH GFR, Lipid panel, PSA  --anticipatory guidance as below in AVS, screening labs above. Health maintenance items as above in HPI discussed/recommended as applicable. Declined pneumonia, influenza, or shingles vaccination.   Lipoma of arm  -reassurance. nontender and no recent changes. If becomes painful or bothersome, consider general surgery eval for removal.   Hyperlipidemia - Plan: Lipid panel  -tolerating Lipitor 69m QD- refilled. Labs pending.  Screening for prostate cancer - Plan: PSA  -We discussed pros and cons of prostate cancer screening, and after this discussion, he chose to have screening done. PSA obtained, and no concerning findings on DRE.   Special screening for malignant neoplasms, colon  -UTD on colonoscopy. hemosure negative.    Vasovagal episode  -with blood draw this am only, no other events at home and denies orthostatic sx's. If any recurrence or new neurologic sx's - RTC or ER.   Meds ordered this encounter  Medications  . atorvastatin (LIPITOR) 10 MG tablet    Sig: Take 1 tablet (10 mg total) by mouth daily.    Dispense:  90 tablet    Refill:  3   Patient Instructions  You should receive a call or letter about your lab results within the next week to 10 days.  Exercise - walking 150 minutes per week.  If any further lightheaded episodes - return to discuss further.   If you change your mind about pneumonia and flu vaccines - return at your convenience to have these given.   Keeping you healthy  Get these tests  Blood pressure- Have your blood pressure checked once a year by your healthcare provider.  Normal blood pressure is 120/80  Weight- Have your body mass index (BMI) calculated to screen for obesity.  BMI is a measure of body fat based on height and weight. You can also calculate your own BMI at [email or web address].[email or web address]  Cholesterol- Have your cholesterol checked every year.  Diabetes- Have your blood sugar checked regularly if you have high blood pressure, high cholesterol, have a family history of diabetes or if you are overweight.  Screening for Colon Cancer- Colonoscopy starting at age 67  Screening may begin sooner depending on your family history and other health conditions. Follow up colonoscopy as directed by your Gastroenterologist.  Screening for Prostate Cancer- Both blood work (PSA) and a rectal exam help screen for Prostate Cancer.  Screening begins at age 1362with African-American men and at age 7751with Caucasian men.  Screening may begin sooner depending on your family history.  Take these medicines  Aspirin- One aspirin daily can help prevent Heart disease and Stroke.  Flu shot- Every fall.  Tetanus- Every 10 years.  Zostavax- Once after the age of 657to prevent  Shingles.  Pneumonia shot- Once after the age of 677 if you are younger than 677 ask your healthcare provider if you need a  Pneumonia shot.  Take these steps  Don't smoke- If you do smoke, talk to your doctor about quitting.  For tips on how to quit, go to www.smokefree.gov or call 1-800-QUIT-NOW.  Be physically active- Exercise 5 days a week for at least 30 minutes.  If you are not already physically active start slow and gradually work up to 30 minutes of moderate physical activity.  Examples of moderate activity include walking briskly, mowing the yard, dancing, swimming, bicycling, etc.  Eat a healthy diet- Eat a variety of healthy food such as fruits, vegetables, low fat milk, low fat cheese, yogurt, lean meant, poultry, fish, beans, tofu, etc. For more information go to www.thenutritionsource.org  Drink alcohol in moderation- Limit alcohol intake to less than two drinks a day. Never drink and drive.  Dentist- Brush and floss twice daily; visit your dentist twice a year.  Depression- Your emotional health is as important as your physical health. If you're feeling down, or losing interest in things you would normally enjoy please talk to your healthcare provider.  Eye exam- Visit your eye doctor every year.  Safe sex- If you may be exposed to a sexually transmitted infection, use a condom.  Seat belts- Seat belts can save your life; always wear one.  Smoke/Carbon Monoxide detectors- These detectors need to be installed on the appropriate level of your home.  Replace batteries at least once a year.  Skin cancer- When out in the sun, cover up and use sunscreen 15 SPF or higher.  Violence- If anyone is threatening you, please tell your healthcare provider.  Living Will/ Health care power of attorney- Speak with your healthcare provider and family.    I personally performed the services described in this documentation, which was scribed in my presence. The recorded information has  been reviewed and considered, and addended by me as needed.

## 2014-09-04 NOTE — Progress Notes (Signed)
   Subjective:    Patient ID: Austin Watkins, male    DOB: [birth year], 68 y.o.   MRN: [id number]  HPI    Review of Systems  Constitutional: Negative.   HENT: Negative.   Eyes: Negative.   Respiratory: Negative.   Cardiovascular: Negative.   Gastrointestinal: Negative.   Endocrine: Negative.   Genitourinary: Negative.   Musculoskeletal: Negative.   Skin: Negative.   Allergic/Immunologic: Negative.   Neurological: Negative.   Hematological: Negative.   Psychiatric/Behavioral: Negative.        Objective:   Physical Exam        Assessment & Plan:

## 2014-09-04 NOTE — Patient Instructions (Signed)
You should receive a call or letter about your lab results within the next week to 10 days.  Exercise - walking 150 minutes per week.  If any further lightheaded episodes - return to discuss further.   If you change your mind about pneumonia and flu vaccines - return at your convenience to have these given.   Keeping you healthy  Get these tests  Blood pressure- Have your blood pressure checked once a year by your healthcare provider.  Normal blood pressure is 120/80  Weight- Have your body mass index (BMI) calculated to screen for obesity.  BMI is a measure of body fat based on height and weight. You can also calculate your own BMI at ViewBanking.si.  Cholesterol- Have your cholesterol checked every year.  Diabetes- Have your blood sugar checked regularly if you have high blood pressure, high cholesterol, have a family history of diabetes or if you are overweight.  Screening for Colon Cancer- Colonoscopy starting at age 48.  Screening may begin sooner depending on your family history and other health conditions. Follow up colonoscopy as directed by your Gastroenterologist.  Screening for Prostate Cancer- Both blood work (PSA) and a rectal exam help screen for Prostate Cancer.  Screening begins at age 42 with African-American men and at age 81 with Caucasian men.  Screening may begin sooner depending on your family history.  Take these medicines  Aspirin- One aspirin daily can help prevent Heart disease and Stroke.  Flu shot- Every fall.  Tetanus- Every 10 years.  Zostavax- Once after the age of 63 to prevent Shingles.  Pneumonia shot- Once after the age of 19; if you are younger than 12, ask your healthcare provider if you need a Pneumonia shot.  Take these steps  Don't smoke- If you do smoke, talk to your doctor about quitting.  For tips on how to quit, go to www.smokefree.gov or call 1-800-QUIT-NOW.  Be physically active- Exercise 5 days a week for at least 30  minutes.  If you are not already physically active start slow and gradually work up to 30 minutes of moderate physical activity.  Examples of moderate activity include walking briskly, mowing the yard, dancing, swimming, bicycling, etc.  Eat a healthy diet- Eat a variety of healthy food such as fruits, vegetables, low fat milk, low fat cheese, yogurt, lean meant, poultry, fish, beans, tofu, etc. For more information go to www.thenutritionsource.org  Drink alcohol in moderation- Limit alcohol intake to less than two drinks a day. Never drink and drive.  Dentist- Brush and floss twice daily; visit your dentist twice a year.  Depression- Your emotional health is as important as your physical health. If you're feeling down, or losing interest in things you would normally enjoy please talk to your healthcare provider.  Eye exam- Visit your eye doctor every year.  Safe sex- If you may be exposed to a sexually transmitted infection, use a condom.  Seat belts- Seat belts can save your life; always wear one.  Smoke/Carbon Monoxide detectors- These detectors need to be installed on the appropriate level of your home.  Replace batteries at least once a year.  Skin cancer- When out in the sun, cover up and use sunscreen 15 SPF or higher.  Violence- If anyone is threatening you, please tell your healthcare provider.  Living Will/ Health care power of attorney- Speak with your healthcare provider and family.

## 2014-09-05 LAB — PSA: PSA: 1.89 ng/mL (ref <=4.00)

## 2014-09-05 LAB — COMPLETE METABOLIC PANEL WITH GFR
ALBUMIN: 4.4 g/dL (ref 3.5–5.2)
ALK PHOS: 67 U/L (ref 39–117)
ALT: 24 U/L (ref 0–53)
AST: 22 U/L (ref 0–37)
BUN: 15 mg/dL (ref 6–23)
CALCIUM: 9.3 mg/dL (ref 8.4–10.5)
CHLORIDE: 105 meq/L (ref 96–112)
CO2: 27 mEq/L (ref 19–32)
Creat: 0.93 mg/dL (ref 0.50–1.35)
GFR, EST NON AFRICAN AMERICAN: 85 mL/min
GFR, Est African American: >89 mL/min
GLUCOSE: 97 mg/dL (ref 70–99)
Potassium: 4.3 mEq/L (ref 3.5–5.3)
Sodium: 139 mEq/L (ref 135–145)
Total Bilirubin: 0.9 mg/dL (ref 0.2–1.2)
Total Protein: 6.9 g/dL (ref 6.0–8.3)

## 2014-09-05 LAB — LIPID PANEL
CHOLESTEROL: 170 mg/dL (ref 0–200)
HDL: 54 mg/dL (ref >39)
LDL Cholesterol: 96 mg/dL (ref 0–99)
TRIGLYCERIDES: 98 mg/dL (ref <150)
Total CHOL/HDL Ratio: 3.1 Ratio
VLDL: 20 mg/dL (ref 0–40)

## 2014-09-13 ENCOUNTER — Encounter: Payer: Self-pay | Admitting: Radiology

## 2015-08-20 ENCOUNTER — Telehealth: Payer: Self-pay | Admitting: Family Medicine

## 2015-08-20 NOTE — Telephone Encounter (Signed)
lmom to call and reschedule his appt that he had with Dr Carlota Raspberry on 09/10/15

## 2015-09-10 ENCOUNTER — Encounter: Payer: Federal, State, Local not specified - PPO | Admitting: Family Medicine

## 2015-10-12 ENCOUNTER — Telehealth: Payer: Self-pay | Admitting: Internal Medicine

## 2015-10-12 NOTE — Telephone Encounter (Signed)
Attempted to reach out to PT to see if an appoinmtent time from the waiting list would be more suitable than the scheduled CPE. No answer no msg left.

## 2015-10-12 NOTE — Telephone Encounter (Signed)
Attempted to reach out to patient to see if appointments that are now available from the wait list would be a better option than the

## 2015-10-22 ENCOUNTER — Encounter: Payer: Federal, State, Local not specified - PPO | Admitting: Family Medicine

## 2015-11-05 ENCOUNTER — Ambulatory Visit (INDEPENDENT_AMBULATORY_CARE_PROVIDER_SITE_OTHER): Payer: Federal, State, Local not specified - PPO | Admitting: Family Medicine

## 2015-11-05 ENCOUNTER — Encounter: Payer: Self-pay | Admitting: Family Medicine

## 2015-11-05 VITALS — BP 132/84 | HR 64 | Temp 97.9°F | Resp 16 | Ht 69.75 in | Wt 189.6 lb

## 2015-11-05 DIAGNOSIS — Z125 Encounter for screening for malignant neoplasm of prostate: Secondary | ICD-10-CM

## 2015-11-05 DIAGNOSIS — Z130 Encounter for screening for diseases of the blood and blood-forming organs and certain disorders involving the immune mechanism: Secondary | ICD-10-CM

## 2015-11-05 DIAGNOSIS — L218 Other seborrheic dermatitis: Secondary | ICD-10-CM

## 2015-11-05 DIAGNOSIS — Z131 Encounter for screening for diabetes mellitus: Secondary | ICD-10-CM | POA: Diagnosis not present

## 2015-11-05 DIAGNOSIS — L219 Seborrheic dermatitis, unspecified: Secondary | ICD-10-CM

## 2015-11-05 DIAGNOSIS — Z0000 Encounter for general adult medical examination without abnormal findings: Secondary | ICD-10-CM

## 2015-11-05 DIAGNOSIS — E785 Hyperlipidemia, unspecified: Secondary | ICD-10-CM

## 2015-11-05 LAB — COMPLETE METABOLIC PANEL WITH GFR
ALBUMIN: 4.6 g/dL (ref 3.6–5.1)
ALK PHOS: 69 U/L (ref 40–115)
ALT: 23 U/L (ref 9–46)
AST: 21 U/L (ref 10–35)
BILIRUBIN TOTAL: 0.8 mg/dL (ref 0.2–1.2)
BUN: 17 mg/dL (ref 7–25)
CO2: 28 mmol/L (ref 20–31)
Calcium: 9.6 mg/dL (ref 8.6–10.3)
Chloride: 103 mmol/L (ref 98–110)
Creat: 0.92 mg/dL (ref 0.70–1.25)
GFR, EST NON AFRICAN AMERICAN: 85 mL/min (ref >=60)
GFR, Est African American: >89 mL/min (ref >=60)
Glucose, Bld: 99 mg/dL (ref 65–99)
Potassium: 4 mmol/L (ref 3.5–5.3)
Sodium: 139 mmol/L (ref 135–146)
TOTAL PROTEIN: 7.4 g/dL (ref 6.1–8.1)

## 2015-11-05 LAB — LIPID PANEL
Cholesterol: 178 mg/dL (ref 125–200)
HDL: 59 mg/dL (ref >=40)
LDL Cholesterol: 98 mg/dL (ref <130)
TRIGLYCERIDES: 104 mg/dL (ref <150)
Total CHOL/HDL Ratio: 3 Ratio (ref <=5.0)
VLDL: 21 mg/dL (ref <30)

## 2015-11-05 LAB — CBC
HEMATOCRIT: 45.4 % (ref 39.0–52.0)
Hemoglobin: 15.7 g/dL (ref 13.0–17.0)
MCH: 33.4 pg (ref 26.0–34.0)
MCHC: 34.6 g/dL (ref 30.0–36.0)
MCV: 96.6 fL (ref 78.0–100.0)
MPV: 9.5 fL (ref 8.6–12.4)
Platelets: 237 10*3/uL (ref 150–400)
RBC: 4.7 MIL/uL (ref 4.22–5.81)
RDW: 12.9 % (ref 11.5–15.5)
WBC: 8.7 10*3/uL (ref 4.0–10.5)

## 2015-11-05 MED ORDER — KETOCONAZOLE 2 % EX SHAM: 1.0000 "application " | MEDICATED_SHAMPOO | CUTANEOUS | Status: DC

## 2015-11-05 MED ORDER — ATORVASTATIN CALCIUM 10 MG PO TABS: 10.0000 mg | ORAL_TABLET | Freq: Every day | ORAL | Status: DC

## 2015-11-05 NOTE — Patient Instructions (Addendum)
You should receive a call or letter about your lab results within the next week to 10 days.  Keeping you healthy  Get these tests  Blood pressure- Have your blood pressure checked once a year by your healthcare provider.  Normal blood pressure is 120/80  Weight- Have your body mass index (BMI) calculated to screen for obesity.  BMI is a measure of body fat based on height and weight. You can also calculate your own BMI at [email or web address].com/bmi/.  Cholesterol- Have your cholesterol checked every year.  Diabetes- Have your blood sugar checked regularly if you have high blood pressure, high cholesterol, have a family history of diabetes or if you are overweight.  Screening for Colon Cancer- Colonoscopy starting at age 50.  Screening may begin sooner depending on your family history and other health conditions. Follow up colonoscopy as directed by your Gastroenterologist.  Screening for Prostate Cancer- Both blood work (PSA) and a rectal exam help screen for Prostate Cancer.  Screening begins at age 40 with African-American men and at age 50 with Caucasian men.  Screening may begin sooner depending on your family history.  Take these medicines  Aspirin- One aspirin daily can help prevent Heart disease and Stroke.  Flu shot- Every fall.  Tetanus- Every 10 years.  Zostavax- Once after the age of 60 to prevent Shingles.  Pneumonia shot- Once after the age of 65; if you are younger than 65, ask your healthcare provider if you need a Pneumonia shot.  Take these steps  Don't smoke- If you do smoke, talk to your doctor about quitting.  For tips on how to quit, go to www.smokefree.gov or call 1-800-QUIT-NOW.  Be physically active- Exercise 5 days a week for at least 30 minutes.  If you are not already physically active start slow and gradually work up to 30 minutes of moderate physical activity.  Examples of moderate activity include walking briskly, mowing the yard, dancing, swimming,  bicycling, etc.  Eat a healthy diet- Eat a variety of healthy food such as fruits, vegetables, low fat milk, low fat cheese, yogurt, lean meant, poultry, fish, beans, tofu, etc. For more information go to www.thenutritionsource.org  Drink alcohol in moderation- Limit alcohol intake to less than two drinks a day. Never drink and drive.  Dentist- Brush and floss twice daily; visit your dentist twice a year.  Depression- Your emotional health is as important as your physical health. If you're feeling down, or losing interest in things you would normally enjoy please talk to your healthcare provider.  Eye exam- Visit your eye doctor every year.  Safe sex- If you may be exposed to a sexually transmitted infection, use a condom.  Seat belts- Seat belts can save your life; always wear one.  Smoke/Carbon Monoxide detectors- These detectors need to be installed on the appropriate level of your home.  Replace batteries at least once a year.  Skin cancer- When out in the sun, cover up and use sunscreen 15 SPF or higher.  Violence- If anyone is threatening you, please tell your healthcare provider.  Living Will/ Health care power of attorney- Speak with your healthcare provider and family. 

## 2015-11-05 NOTE — Progress Notes (Signed)
Subjective:    Patient ID: Austin Watkins, male    DOB: 28-Nov-1946, 69 y.o.   MRN: [id number] By signing my name below, I, Zola Button, attest that this documentation has been prepared under the direction and in the presence of Merri Ray, MD.  Electronically Signed: Zola Button, Medical Scribe. 11/05/2015. 10:15 AM.  HPI HPI Comments: Austin Watkins is a 69 y.o. male who presents to the [facility] for an annual exam. Last physical was October 2015. Patient has had 2 episodes of syncope while having his blood drawn: once last year and once when he was about 69 years old. He has his blood drawn every year for physical, most of the time without any problems.  Cancer screening: Colon cancer screening - Colonoscopy at age 16, repeat in 62 years. Diverticulosis noted, no diverticulitis.  Skin cancer screening - He is followed by dermatology every year. Prostate cancer screening - Slight increase from 2 years ago, but still normal range. He denies difficulty urinating, hematuria, and other urinary symptoms. He agrees to have prostate cancer screening done today. Lab Results  Component Value Date   PSA 1.89 09/04/2014   PSA 1.48 03/28/2013    Immunizations: He refused Prevnar and Pneumovax last year, as well as flu vaccine. Refused Zostavax.  Immunization History  Administered Date(s) Administered  . Td 12/02/2007    Depression screening:  Depression screen [facility] 2/9 11/05/2015 09/04/2014 03/13/2014  Decreased Interest 0 0 0  Down, Depressed, Hopeless 0 0 0  PHQ - 2 Score 0 0 0   Fall screening: 0 falls in the past year.   Functional status screening: No positive responses.   Vision: He sees Dr. Gershon Crane every year.  Visual Acuity Screening   Right eye Left eye Both eyes  Without correction: _0  With correction:       Dentist: He sees his dentist every 6 months.  Exercise: Patient exercises about an hour a day when the weather is good.  Advanced  directives: He does have a living will and his wife is aware of his wishes.  Hyperlipidemia: On Lipitor 10 mg qd. Both his liver tests and lipid tests have been stable for some time. He has been compliant with the Lipitor without any problems. Patient denies myalgias and other side-effects. Lab Results  Component Value Date   CHOL 170 09/04/2014   HDL 54 09/04/2014   LDLCALC 96 09/04/2014   TRIG 98 09/04/2014   CHOLHDL 3.1 09/04/2014    Lab Results  Component Value Date   ALT 24 09/04/2014   AST 22 09/04/2014   ALKPHOS 67 09/04/2014   BILITOT 0.9 09/04/2014    Seborrheic dermatitis: He was put on ketoconazole shampoo by Dr. Elder Cyphers for this. He uses this about once every 2 weeks and uses regular shampoo otherwise.  Patient Active Problem List   Diagnosis Date Noted  . Cellulitis of foot, right 04/25/2014  . Closed fracture of fifth metatarsal bone of right foot 04/18/2014  . Closed fracture of the right second, third, and fifth proximal phalanges of the foot 04/18/2014  . Lipid disorder 03/29/2012  . Allergy history unknown 03/08/2012  . Bronchospasm 03/08/2012   Past Medical History  Diagnosis Date  . Allergy   . Hyperlipidemia    Past Surgical History  Procedure Laterality Date  . Knee scoped     Allergies  Allergen Reactions  . Neosporin [Neomycin-Bacitracin Zn-Polymyx] Other (See Comments)    Triple antibiotic -  rash on skin   Prior to Admission medications   Medication Sig Start Date End Date Taking? Authorizing Provider  aspirin 81 MG tablet Take 81 mg by mouth daily.   Yes Historical Provider, MD  atorvastatin (LIPITOR) 10 MG tablet Take 1 tablet (10 mg total) by mouth daily. 09/04/14  Yes Wendie Agreste, MD  ketoconazole (NIZORAL) 2 % shampoo Apply 1 application topically 2 (two) times a week.   Yes Historical Provider, MD  albuterol (PROVENTIL HFA;VENTOLIN HFA) 108 (90 BASE) MCG/ACT inhaler Inhale 2 puffs into the lungs every 6 (six) hours as needed.     Historical Provider, MD  cetirizine (ZYRTEC) 10 MG tablet Take 10 mg by mouth daily.    Historical Provider, MD  fluticasone (FLONASE) 50 MCG/ACT nasal spray Place 2 sprays into the nose daily.    Historical Provider, MD  mupirocin ointment (BACTROBAN) 2 % Apply to affected area TID for 7 days. Patient not taking: Reported on 11/05/2015 04/21/14   Silverio Decamp, MD  PredniSONE 5 MG KIT 12 day dosepack po Patient not taking: Reported on 11/05/2015 07/03/14   Gregor Hams, MD  traMADol (ULTRAM) 50 MG tablet 1-2 tabs by mouth Q8 hours, maximum 6 tabs per day. Patient not taking: Reported on 11/05/2015 05/02/14   Silverio Decamp, MD   Social History   Social History  . Marital Status: Married    Spouse Name: N/A  . Number of Children: N/A  . Years of Education: N/A   Occupational History  . Retired    Social History Main Topics  . Smoking status: Never Smoker   . Smokeless tobacco: Never Used  . Alcohol Use: No  . Drug Use: No  . Sexual Activity: Not on file   Other Topics Concern  . Not on file   Social History Narrative   Married        Review of Systems 13 point ROS reviewed on patient health survey. Negative other than listed above or in nursing note. See nursing note.     Objective:   Physical Exam  Constitutional: He is oriented to person, place, and time. He appears well-developed and well-nourished.  HENT:  Head: Normocephalic and atraumatic.  Right Ear: External ear normal.  Left Ear: External ear normal.  Mouth/Throat: Oropharynx is clear and moist.  Eyes: Conjunctivae and EOM are normal. Pupils are equal, round, and reactive to light.  Neck: Normal range of motion. Neck supple. No thyromegaly present.  Cardiovascular: Normal rate, regular rhythm, normal heart sounds and intact distal pulses.   Pulmonary/Chest: Effort normal and breath sounds normal. No respiratory distress. He has no wheezes.  Abdominal: Soft. He exhibits no distension. There is no  tenderness. Hernia confirmed negative in the right inguinal area and confirmed negative in the left inguinal area.  Genitourinary: Prostate is enlarged (possibly slightly  enlarged with firm central area, no nodules palpated. ). Prostate is not tender. Left testis shows no mass.  Musculoskeletal: Normal range of motion. He exhibits no edema or tenderness.  Lymphadenopathy:    He has no cervical adenopathy.  Neurological: He is alert and oriented to person, place, and time. He has normal reflexes.  Skin: Skin is warm and dry.  Psychiatric: He has a normal mood and affect. His behavior is normal.  Vitals reviewed.     Filed Vitals:   11/05/15 0939  BP: 132/84  Pulse: 64  Temp: 97.9 F (36.6 C)  TempSrc: Oral  Resp: 16  Height: 5'  9.75" (1.772 m)  Weight: 189 lb 9.6 oz (86.002 kg)  SpO2: 95%       Assessment & Plan:   Austin Watkins is a 69 y.o. male Medicare annual wellness visit, subsequent  --anticipatory guidance as below in AVS, screening labs above. Health maintenance items as above in HPI discussed/recommended as applicable. Recommended immunizations above, and to RTC if he changes his mind.   Seborrheic dermatitis of scalp - Plan: ketoconazole (NIZORAL) 2 % shampoo refilled for prn use.   Hyperlipidemia - Plan: atorvastatin (LIPITOR) 10 MG tablet, COMPLETE METABOLIC PANEL WITH GFR, Lipid panel  -refilled lipitor.  Labs stable for some time. Refilled x 1 year.   Screening for diabetes mellitus - Plan: COMPLETE METABOLIC PANEL WITH GFR  Screening, anemia, deficiency, iron - Plan: CBC  Special screening, prostate cancer - Plan: PSA, Medicare  We discussed pros and cons of prostate cancer screening, and after this discussion, he chose to have screening done. PSA obtained, and no concerning findings on DRE, although likely has some degree of BPH.  If significant increase in PSA - consider urology eval.    Meds ordered this encounter  Medications  . DISCONTD: ketoconazole  (NIZORAL) 2 % shampoo    Sig: Apply 1 application topically 2 (two) times a week.  Marland Kitchen ketoconazole (NIZORAL) 2 % shampoo    Sig: Apply 1 application topically 2 (two) times a week.    Dispense:  120 mL    Refill:  2  . atorvastatin (LIPITOR) 10 MG tablet    Sig: Take 1 tablet (10 mg total) by mouth daily.    Dispense:  90 tablet    Refill:  3   Patient Instructions  You should receive a call or letter about your lab results within the next week to 10 days.   Keeping you healthy  Get these tests  Blood pressure- Have your blood pressure checked once a year by your healthcare provider.  Normal blood pressure is 120/80  Weight- Have your body mass index (BMI) calculated to screen for obesity.  BMI is a measure of body fat based on height and weight. You can also calculate your own BMI at [employer].  Cholesterol- Have your cholesterol checked every year.  Diabetes- Have your blood sugar checked regularly if you have high blood pressure, high cholesterol, have a family history of diabetes or if you are overweight.  Screening for Colon Cancer- Colonoscopy starting at age 84.  Screening may begin sooner depending on your family history and other health conditions. Follow up colonoscopy as directed by your Gastroenterologist.  Screening for Prostate Cancer- Both blood work (PSA) and a rectal exam help screen for Prostate Cancer.  Screening begins at age 82 with African-American men and at age 74 with Caucasian men.  Screening may begin sooner depending on your family history.  Take these medicines  Aspirin- One aspirin daily can help prevent Heart disease and Stroke.  Flu shot- Every fall.  Tetanus- Every 10 years.  Zostavax- Once after the age of 28 to prevent Shingles.  Pneumonia shot- Once after the age of 38; if you are younger than 68, ask your healthcare provider if you need a Pneumonia shot.  Take these steps  Don't smoke- If you do smoke, talk to your doctor  about quitting.  For tips on how to quit, go to www.smokefree.gov or call 1-800-QUIT-NOW.  Be physically active- Exercise 5 days a week for at least 30 minutes.  If you are not already  physically active start slow and gradually work up to 30 minutes of moderate physical activity.  Examples of moderate activity include walking briskly, mowing the yard, dancing, swimming, bicycling, etc.  Eat a healthy diet- Eat a variety of healthy food such as fruits, vegetables, low fat milk, low fat cheese, yogurt, lean meant, poultry, fish, beans, tofu, etc. For more information go to www.thenutritionsource.org  Drink alcohol in moderation- Limit alcohol intake to less than two drinks a day. Never drink and drive.  Dentist- Brush and floss twice daily; visit your dentist twice a year.  Depression- Your emotional health is as important as your physical health. If you're feeling down, or losing interest in things you would normally enjoy please talk to your healthcare provider.  Eye exam- Visit your eye doctor every year.  Safe sex- If you may be exposed to a sexually transmitted infection, use a condom.  Seat belts- Seat belts can save your life; always wear one.  Smoke/Carbon Monoxide detectors- These detectors need to be installed on the appropriate level of your home.  Replace batteries at least once a year.  Skin cancer- When out in the sun, cover up and use sunscreen 15 SPF or higher.  Violence- If anyone is threatening you, please tell your healthcare provider.  Living Will/ Health care power of attorney- Speak with your healthcare provider and family.    I personally performed the services described in this documentation, which was scribed in my presence. The recorded information has been reviewed and considered, and addended by me as needed.

## 2015-11-05 NOTE — Progress Notes (Signed)
   Subjective:    Patient ID: Austin Watkins, male    DOB: [birth year], 69 y.o.   MRN: [id number]  HPI    Review of Systems  Constitutional: Negative.   HENT: Positive for rhinorrhea.   Eyes: Negative.   Respiratory: Negative.   Cardiovascular: Negative.   Gastrointestinal: Negative.   Endocrine: Negative.   Genitourinary: Negative.   Musculoskeletal: Negative.   Skin: Negative.   Allergic/Immunologic: Negative.   Neurological: Negative.   Hematological: Negative.   Psychiatric/Behavioral: Negative.        Objective:   Physical Exam        Assessment & Plan:

## 2015-11-06 LAB — PSA, MEDICARE: PSA: 2.12 ng/mL (ref <=4.00)

## 2015-12-13 ENCOUNTER — Encounter: Payer: Self-pay | Admitting: Family Medicine

## 2015-12-26 ENCOUNTER — Telehealth: Payer: Self-pay

## 2015-12-26 NOTE — Telephone Encounter (Signed)
I am not familiar what the GFR is and its significance. Can someone explain so I can tell pt.

## 2015-12-26 NOTE — Telephone Encounter (Addendum)
Mardene Celeste would like to speak with someone to discuss his PSA and GFR states his GFR seem to be irregular. Would like to discuss with someone. Please call [phone]

## 2015-12-27 NOTE — Telephone Encounter (Signed)
Counseled patient's wife Mardene Celeste extensively regarding PSA levels, GFR. I feel very reassured by patient's labs. Recommended that patient not have his PSA or DRE done yearly or anymore which is in with UpToDate and USPSTF recommendations. Patient will rtc if he develops symptoms such as hematuria, difficulty urinating, pelvic fullness, weight loss, fever, decreased appetite. Patient's wife felt reassured after our discussion.

## 2016-11-10 ENCOUNTER — Encounter: Payer: Federal, State, Local not specified - PPO | Admitting: Family Medicine

## 2016-11-12 ENCOUNTER — Encounter: Payer: Federal, State, Local not specified - PPO | Admitting: Family Medicine

## 2016-11-13 ENCOUNTER — Encounter: Payer: Self-pay | Admitting: Family Medicine

## 2016-11-13 ENCOUNTER — Ambulatory Visit (INDEPENDENT_AMBULATORY_CARE_PROVIDER_SITE_OTHER): Payer: Federal, State, Local not specified - PPO | Admitting: Family Medicine

## 2016-11-13 VITALS — BP 128/82 | HR 70 | Temp 98.7°F | Resp 16 | Ht 69.75 in | Wt 187.6 lb

## 2016-11-13 DIAGNOSIS — N529 Male erectile dysfunction, unspecified: Secondary | ICD-10-CM | POA: Diagnosis not present

## 2016-11-13 DIAGNOSIS — E785 Hyperlipidemia, unspecified: Secondary | ICD-10-CM

## 2016-11-13 DIAGNOSIS — L219 Seborrheic dermatitis, unspecified: Secondary | ICD-10-CM

## 2016-11-13 DIAGNOSIS — Z0000 Encounter for general adult medical examination without abnormal findings: Secondary | ICD-10-CM | POA: Diagnosis not present

## 2016-11-13 DIAGNOSIS — Z114 Encounter for screening for human immunodeficiency virus [HIV]: Secondary | ICD-10-CM | POA: Diagnosis not present

## 2016-11-13 DIAGNOSIS — Z1159 Encounter for screening for other viral diseases: Secondary | ICD-10-CM | POA: Diagnosis not present

## 2016-11-13 DIAGNOSIS — Z125 Encounter for screening for malignant neoplasm of prostate: Secondary | ICD-10-CM

## 2016-11-13 MED ORDER — ATORVASTATIN CALCIUM 10 MG PO TABS: 10.0000 mg | ORAL_TABLET | Freq: Every day | ORAL | 3 refills | Status: DC

## 2016-11-13 MED ORDER — KETOCONAZOLE 2 % EX SHAM: 1.0000 "application " | MEDICATED_SHAMPOO | CUTANEOUS | 2 refills | Status: DC

## 2016-11-13 NOTE — Progress Notes (Signed)
By signing my name below I, Tereasa Coop, attest that this documentation has been prepared under the direction and in the presence of Wendie Agreste, MD. Electonically Signed. Tereasa Coop, Scribe 11/13/2016 at 10:02 AM   Subjective:    Patient ID: Austin Watkins, male    DOB: 1946/09/02, 70 y.o.   MRN: [id number]  Chief Complaint  Patient presents with  . Annual Exam  . Medication Refill    LIPITOR, and NIZORAL    HPI Austin Watkins is a 70 y.o. male who presents to the [facility] for annual physical. History of HLD, allergic rhinitis. Last seen for a medicare December 2016. Pt at this time is c/o that he has been having difficulty maintaining a full erection for the past few months. States that he does not want Viagra or Cialis.   Colon Cancer screening Pt's last colonoscopy was performed at age 87, plan for repeat In 10 years. Diverticulosis noted, no history of diverticulitis.  Skin Cancer screening Seen by dermatologist once a year  Prostate Cancer screening Lab Results  Component Value Date   PSA 2.12 11/05/2015   PSA 1.89 09/04/2014   PSA 1.48 03/28/2013  slight increase over the last few years, still within normal limits. Pt was asymptomatic at last aphysical  Immunizations  Immunization History  Administered Date(s) Administered  . Td 12/02/2007  Refused shingles, pneumonia, and influenza vaccinations. Pt still refusing immunizations   Depression screening Depression screen [facility] 2/9 11/13/2016 11/05/2015 09/04/2014 03/13/2014  Decreased Interest 0 0 0 0  Down, Depressed, Hopeless 0 0 0 0  PHQ - 2 Score 0 0 0 0   Fall screening No falls in the past year  Functional Status Survey: Is the patient deaf or have difficulty hearing?: No Does the patient have difficulty seeing, even when wearing glasses/contacts?: No Does the patient have difficulty concentrating, remembering, or making decisions?: No Does the patient have difficulty walking or  climbing stairs?: No Does the patient have difficulty dressing or bathing?: No Does the patient have difficulty doing errands alone such as visiting a doctor's office or shopping?: No  Vision screening  Visual Acuity Screening   Right eye Left eye Both eyes  Without correction: 20/25 20/25 20/25   With correction:      Exercise Pt reports exercising an hr a day almost everyday.   Dentist Pt sees his dentist every 6 months.  Advanced directive  Pt has a living will, DNR and healthcare power of attorney. Copy requested.   HIV and Hep C Screening Pt agrees to HIV and Hep C testing today because he has not been tested for HIV or Hep C before.   HLD Lab Results  Component Value Date   CHOL 178 11/05/2015   HDL 59 11/05/2015   LDLCALC 98 11/05/2015   TRIG 104 11/05/2015   CHOLHDL 3.0 11/05/2015   Lab Results  Component Value Date   ALT 23 11/05/2015   AST 21 11/05/2015   ALKPHOS 69 11/05/2015   BILITOT 0.8 11/05/2015  Takes 10mg  lipitor QD.  Allergic Rhinitis  Takes zyrtec and Flonase as needed.   Seborrheic dermatitis  Pt has been using his Nizoral shampoo once a week and states he is out.    Patient Active Problem List   Diagnosis Date Noted  . Cellulitis of foot, right 04/25/2014  . Closed fracture of fifth metatarsal bone of right foot 04/18/2014  . Closed fracture of the right second, third, and fifth  proximal phalanges of the foot 04/18/2014  . Lipid disorder 03/29/2012  . Allergy history unknown 03/08/2012  . Bronchospasm 03/08/2012   Past Medical History:  Diagnosis Date  . Allergy   . Hyperlipidemia    Past Surgical History:  Procedure Laterality Date  . knee scoped     Allergies  Allergen Reactions  . Neosporin [Neomycin-Bacitracin Zn-Polymyx] Other (See Comments)    Triple antibiotic - rash on skin   Prior to Admission medications   Medication Sig Start Date End Date Taking? Authorizing Provider  albuterol (PROVENTIL HFA;VENTOLIN HFA) 108  (90 BASE) MCG/ACT inhaler Inhale 2 puffs into the lungs every 6 (six) hours as needed.   Yes Historical Provider, MD  aspirin 81 MG tablet Take 81 mg by mouth daily.   Yes Historical Provider, MD  atorvastatin (LIPITOR) 10 MG tablet Take 1 tablet (10 mg total) by mouth daily. 11/05/15  Yes Wendie Agreste, MD  fluticasone (FLONASE) 50 MCG/ACT nasal spray Place 2 sprays into the nose daily.   Yes Historical Provider, MD  ketoconazole (NIZORAL) 2 % shampoo Apply 1 application topically 2 (two) times a week. 11/05/15  Yes Wendie Agreste, MD  cetirizine (ZYRTEC) 10 MG tablet Take 10 mg by mouth daily.    Historical Provider, MD   Social History   Social History  . Marital status: Married    Spouse name: N/A  . Number of children: N/A  . Years of education: N/A   Occupational History  . Retired    Social History Main Topics  . Smoking status: Never Smoker  . Smokeless tobacco: Never Used  . Alcohol use No  . Drug use: No  . Sexual activity: Not on file   Other Topics Concern  . Not on file   Social History Narrative   Married         Review of Systems 13 point ROS reviewed and negative except for positive for seasonal allergies.     Objective:   Physical Exam  Constitutional: He is oriented to person, place, and time. He appears well-developed and well-nourished.  HENT:  Head: Normocephalic and atraumatic.  Right Ear: External ear normal.  Left Ear: External ear normal.  Mouth/Throat: Oropharynx is clear and moist.  Eyes: Conjunctivae and EOM are normal. Pupils are equal, round, and reactive to light.  Neck: Normal range of motion. Neck supple. No thyromegaly present.  Cardiovascular: Normal rate, regular rhythm, normal heart sounds and intact distal pulses.   Pulmonary/Chest: Effort normal and breath sounds normal. No respiratory distress. He has no wheezes.  Abdominal: Soft. He exhibits no distension. There is no tenderness. Hernia confirmed negative in the right  inguinal area and confirmed negative in the left inguinal area.  Genitourinary: Prostate is enlarged (mild, no nodules no masses). Right testis shows no mass. Left testis shows no mass.  Musculoskeletal: Normal range of motion. He exhibits no edema or tenderness.  Lymphadenopathy:    He has no cervical adenopathy.  Neurological: He is alert and oriented to person, place, and time. He has normal reflexes.  Skin: Skin is warm and dry.  Psychiatric: He has a normal mood and affect. His behavior is normal.  Vitals reviewed.   Vitals:   11/13/16 0918 11/13/16 0925  BP: (!) 158/78 128/82  Pulse: 70   Resp: 16   Temp: 98.7 F (37.1 C)   TempSrc: Oral   SpO2: 97%   Weight: 187 lb 9.6 oz (85.1 kg)   Height: 5' 9.75" (1.772  m)          Assessment & Plan:   Austin Watkins is a 70 y.o. male Medicare annual wellness visit, subsequent  - anticipatory guidance as below in AVS, screening labs if needed. Health maintenance items as above in HPI discussed/recommended as applicable.   - no concerning responses on depression, fall, or functional status screening. Any positive responses noted as above. Advanced directives discussed as in CHL.   Hyperlipidemia, unspecified hyperlipidemia type - Plan: Comprehensive metabolic panel, Lipid panel, atorvastatin (LIPITOR) 10 MG tablet  - check labs, continue same dose lipitor for now.   Seborrheic dermatitis of scalp - Plan: ketoconazole (NIZORAL) 2 % shampoo  - continue Nizoral as prn use.   Erectile dysfunction, unspecified erectile dysfunction type - Plan: Testosterone  - check testosterone. Declined meds for now. If low testosterone, verify with at least one repeat.   Screening for prostate cancer - Plan: PSA  - We discussed pros and cons of prostate cancer screening, and after this discussion, he chose to have screening done. PSA obtained, and no concerning findings on DRE, just enlarged.   Need for hepatitis C screening test - Plan: Hepatitis  C antibody  Screening for HIV (human immunodeficiency virus) - Plan: HIV antibody   Meds ordered this encounter  Medications  . atorvastatin (LIPITOR) 10 MG tablet    Sig: Take 1 tablet (10 mg total) by mouth daily.    Dispense:  90 tablet    Refill:  3  . ketoconazole (NIZORAL) 2 % shampoo    Sig: Apply 1 application topically 2 (two) times a week.    Dispense:  120 mL    Refill:  2   Patient Instructions   I will check testosterone level, but if that is low, will need to be repeated at least once to verify accuracy of low reading. Let me know if you have like to try medications such as Viagra as we have discussed that today. Lipitor dose for now. Let me know if you have questions.  Keeping you healthy  Get these tests  Blood pressure- Have your blood pressure checked once a year by your healthcare provider.  Normal blood pressure is 120/80  Weight- Have your body mass index (BMI) calculated to screen for obesity.  BMI is a measure of body fat based on height and weight. You can also calculate your own BMI at ViewBanking.[email or web address].  Cholesterol- Have your cholesterol checked every year.  Diabetes- Have your blood sugar checked regularly if you have high blood pressure, high cholesterol, have a family history of diabetes or if you are overweight.  Screening for Colon Cancer- Colonoscopy starting at age 84.  Screening may begin sooner depending on your family history and other health conditions. Follow up colonoscopy as directed by your Gastroenterologist.  Screening for Prostate Cancer- Both blood work (PSA) and a rectal exam help screen for Prostate Cancer.  Screening begins at age 79 with African-American men and at age 38 with Caucasian men.  Screening may begin sooner depending on your family history.  Take these medicines  Aspirin- One aspirin daily can help prevent Heart disease and Stroke.  Flu shot- Every fall.  Tetanus- Every 10 years.  Zostavax- Once after the  age of 73 to prevent Shingles.  Pneumonia shot- Once after the age of 76; if you are younger than 80, ask your healthcare provider if you need a Pneumonia shot.  Take these steps  Don't smoke- If you do smoke, talk  to your doctor about quitting.  For tips on how to quit, go to www.smokefree.gov or call 1-800-QUIT-NOW.  Be physically active- Exercise 5 days a week for at least 30 minutes.  If you are not already physically active start slow and gradually work up to 30 minutes of moderate physical activity.  Examples of moderate activity include walking briskly, mowing the yard, dancing, swimming, bicycling, etc.  Eat a healthy diet- Eat a variety of healthy food such as fruits, vegetables, low fat milk, low fat cheese, yogurt, lean meant, poultry, fish, beans, tofu, etc. For more information go to www.thenutritionsource.org  Drink alcohol in moderation- Limit alcohol intake to less than two drinks a day. Never drink and drive.  Dentist- Brush and floss twice daily; visit your dentist twice a year.  Depression- Your emotional health is as important as your physical health. If you're feeling down, or losing interest in things you would normally enjoy please talk to your healthcare provider.  Eye exam- Visit your eye doctor every year.  Safe sex- If you may be exposed to a sexually transmitted infection, use a condom.  Seat belts- Seat belts can save your life; always wear one.  Smoke/Carbon Monoxide detectors- These detectors need to be installed on the appropriate level of your home.  Replace batteries at least once a year.  Skin cancer- When out in the sun, cover up and use sunscreen 15 SPF or higher.  Violence- If anyone is threatening you, please tell your healthcare provider.  Living Will/ Health care power of attorney- Speak with your healthcare provider and family.    IF you received an x-ray today, you will receive an invoice from [facility]. Please contact [facility]  Radiology at [phone] with questions or concerns regarding your invoice.   IF you received labwork today, you will receive an invoice from [employer]. Please contact [facility] at [phone] with questions or concerns regarding your invoice.   Our billing staff will not be able to assist you with questions regarding bills from these companies.  You will be contacted with the lab results as soon as they are available. The fastest way to get your results is to activate your My Chart account. Instructions are located on the last page of this paperwork. If you have not heard from [place] regarding the results in 2 weeks, please contact this office.       I personally performed the services described in this documentation, which was scribed in my presence. The recorded information has been reviewed and considered, and addended by me as needed.   Signed,   Merri Ray, MD [facility].  11/15/16 12:15 PM

## 2016-11-13 NOTE — Patient Instructions (Addendum)
I will check testosterone level, but if that is low, will need to be repeated at least once to verify accuracy of low reading. Let me know if you have like to try medications such as Viagra as we have discussed that today. Lipitor dose for now. Let me know if you have questions.  Keeping you healthy  Get these tests  Blood pressure- Have your blood pressure checked once a year by your healthcare provider.  Normal blood pressure is 120/80  Weight- Have your body mass index (BMI) calculated to screen for obesity.  BMI is a measure of body fat based on height and weight. You can also calculate your own BMI at ViewBanking[employer][email or web address].  Cholesterol- Have your cholesterol checked every year.  Diabetes- Have your blood sugar checked regularly if you have high blood pressure, high cholesterol, have a family history of diabetes or if you are overweight.  Screening for Colon Cancer- Colonoscopy starting at age 87.  Screening may begin sooner depending on your family history and other health conditions. Follow up colonoscopy as directed by your Gastroenterologist.  Screening for Prostate Cancer- Both blood work (PSA) and a rectal exam help screen for Prostate Cancer.  Screening begins at age 73 with African-American men and at age 35 with Caucasian men.  Screening may begin sooner depending on your family history.  Take these medicines  Aspirin- One aspirin daily can help prevent Heart disease and Stroke.  Flu shot- Every fall.  Tetanus- Every 10 years.  Zostavax- Once after the age of 34 to prevent Shingles.  Pneumonia shot- Once after the age of 48; if you are younger than 21, ask your healthcare provider if you need a Pneumonia shot.  Take these steps  Don't smoke- If you do smoke, talk to your doctor about quitting.  For tips on how to quit, go to www.smokefree.gov or call 1-800-QUIT-NOW.  Be physically active- Exercise 5 days a week for at least 30 minutes.  If you are not already  physically active start slow and gradually work up to 30 minutes of moderate physical activity.  Examples of moderate activity include walking briskly, mowing the yard, dancing, swimming, bicycling, etc.  Eat a healthy diet- Eat a variety of healthy food such as fruits, vegetables, low fat milk, low fat cheese, yogurt, lean meant, poultry, fish, beans, tofu, etc. For more information go to www.thenutritionsource.org  Drink alcohol in moderation- Limit alcohol intake to less than two drinks a day. Never drink and drive.  Dentist- Brush and floss twice daily; visit your dentist twice a year.  Depression- Your emotional health is as important as your physical health. If you're feeling down, or losing interest in things you would normally enjoy please talk to your healthcare provider.  Eye exam- Visit your eye doctor every year.  Safe sex- If you may be exposed to a sexually transmitted infection, use a condom.  Seat belts- Seat belts can save your life; always wear one.  Smoke/Carbon Monoxide detectors- These detectors need to be installed on the appropriate level of your home.  Replace batteries at least once a year.  Skin cancer- When out in the sun, cover up and use sunscreen 15 SPF or higher.  Violence- If anyone is threatening you, please tell your healthcare provider.  Living Will/ Health care power of attorney- Speak with your healthcare provider and family.    IF you received an x-ray today, you will receive an invoice from [facility]. Please contact [facility] Radiology at [phone] with  questions or concerns regarding your invoice.   IF you received labwork today, you will receive an invoice from [employer]. Please contact [facility] at [phone] with questions or concerns regarding your invoice.   Our billing staff will not be able to assist you with questions regarding bills from these companies.  You will be contacted with the lab  results as soon as they are available. The fastest way to get your results is to activate your My Chart account. Instructions are located on the last page of this paperwork. If you have not heard from [place] regarding the results in 2 weeks, please contact this office.

## 2016-11-14 LAB — COMPREHENSIVE METABOLIC PANEL
A/G RATIO: 1.8 (ref 1.2–2.2)
ALBUMIN: 5.5 g/dL — AB (ref 3.6–4.8)
ALK PHOS: 93 IU/L (ref 39–117)
ALT: 33 IU/L (ref 0–44)
AST: 27 IU/L (ref 0–40)
BUN/Creatinine Ratio: 21 (ref 10–24)
BUN: 19 mg/dL (ref 8–27)
Bilirubin Total: 0.9 mg/dL (ref 0.0–1.2)
CHLORIDE: 97 mmol/L (ref 96–106)
CO2: 22 mmol/L (ref 18–29)
Calcium: 10 mg/dL (ref 8.6–10.2)
Creatinine, Ser: 0.92 mg/dL (ref 0.76–1.27)
GFR calc Af Amer: 98 mL/min/{1.73_m2} (ref >59)
GFR calc non Af Amer: 85 mL/min/{1.73_m2} (ref >59)
GLOBULIN, TOTAL: 3 g/dL (ref 1.5–4.5)
Glucose: 100 mg/dL — ABNORMAL HIGH (ref 65–99)
POTASSIUM: 4.4 mmol/L (ref 3.5–5.2)
SODIUM: 142 mmol/L (ref 134–144)
Total Protein: 8.5 g/dL (ref 6.0–8.5)

## 2016-11-14 LAB — LIPID PANEL
CHOLESTEROL TOTAL: 210 mg/dL — AB (ref 100–199)
Chol/HDL Ratio: 3.2 ratio units (ref 0.0–5.0)
HDL: 65 mg/dL (ref >39)
LDL Calculated: 113 mg/dL — ABNORMAL HIGH (ref 0–99)
Triglycerides: 160 mg/dL — ABNORMAL HIGH (ref 0–149)
VLDL Cholesterol Cal: 32 mg/dL (ref 5–40)

## 2016-11-14 LAB — HEPATITIS C ANTIBODY: Hep C Virus Ab: 0.2 s/co ratio (ref 0.0–0.9)

## 2016-11-14 LAB — HIV ANTIBODY (ROUTINE TESTING W REFLEX): HIV Screen 4th Generation wRfx: NONREACTIVE

## 2016-11-14 LAB — TESTOSTERONE: Testosterone: 675 ng/dL (ref 264–916)

## 2016-11-14 LAB — PSA: PROSTATE SPECIFIC AG, SERUM: 2.6 ng/mL (ref 0.0–4.0)

## 2016-12-26 ENCOUNTER — Ambulatory Visit (INDEPENDENT_AMBULATORY_CARE_PROVIDER_SITE_OTHER): Payer: Federal, State, Local not specified - PPO | Admitting: Physician Assistant

## 2016-12-26 VITALS — BP 118/76 | HR 84 | Temp 98.8°F | Ht 69.75 in | Wt 187.8 lb

## 2016-12-26 DIAGNOSIS — R6889 Other general symptoms and signs: Secondary | ICD-10-CM | POA: Diagnosis not present

## 2016-12-26 MED ORDER — GUAIFENESIN ER 1200 MG PO TB12: 1.0000 | ORAL_TABLET | Freq: Two times a day (BID) | ORAL | 1 refills | Status: DC | PRN

## 2016-12-26 MED ORDER — OSELTAMIVIR PHOSPHATE 75 MG PO CAPS: 75.0000 mg | ORAL_CAPSULE | Freq: Two times a day (BID) | ORAL | 0 refills | Status: DC

## 2016-12-26 MED ORDER — BENZONATATE 100 MG PO CAPS: 100.0000 mg | ORAL_CAPSULE | Freq: Three times a day (TID) | ORAL | 0 refills | Status: DC | PRN

## 2016-12-26 NOTE — Progress Notes (Signed)
[facility] [address], [place] [address] [phone]  Date:  12/26/2016   Name:  KEISHAWN FANELLA   DOB:  11-Apr-1946   MRN:  [id number]  PCP:  Wendie Agreste, MD    History of Present Illness:  NAREN NEIDERHISER is a 71 y.o. male patient who presents to [facility] for cc of chills cough.     Patient reports 1 day of body aches, fever, and cough.  Patient with fever documented with range of up to 102.6.  Wife administered corticetin/acetaminophen 500 combo which would resolve the fever, but it would return.  Dry cough predominant without sob or dyspnea.  Congestion and mild throat pain.  Hydrating minimally.  Sick contact with grandchild who had flu one week ago. Reports 1 month ago with complaint of a mild cough, that was more apparent to wife then him.  He does have hx of bronchitis.  Patient Active Problem List   Diagnosis Date Noted  . Cellulitis of foot, right 04/25/2014  . Closed fracture of fifth metatarsal bone of right foot 04/18/2014  . Closed fracture of the right second, third, and fifth proximal phalanges of the foot 04/18/2014  . Lipid disorder 03/29/2012  . Allergy history unknown 03/08/2012  . Bronchospasm 03/08/2012    Past Medical History:  Diagnosis Date  . Allergy   . Hyperlipidemia     Past Surgical History:  Procedure Laterality Date  . knee scoped      Social History  Substance Use Topics  . Smoking status: Never Smoker  . Smokeless tobacco: Never Used  . Alcohol use No    Family History  Problem Relation Age of Onset  . Heart disease Mother   . Thyroid disease Mother   . Hyperlipidemia Mother   . Heart disease Father   . Stroke Father   . Diabetes Brother     Allergies  Allergen Reactions  . Neosporin [Neomycin-Bacitracin Zn-Polymyx] Other (See Comments)    Triple antibiotic - rash on skin    Medication list has been reviewed and updated.  Current Outpatient Prescriptions on File Prior to Visit  Medication Sig  Dispense Refill  . aspirin 81 MG tablet Take 81 mg by mouth daily.    Marland Kitchen atorvastatin (LIPITOR) 10 MG tablet Take 1 tablet (10 mg total) by mouth daily. 90 tablet 3  . ketoconazole (NIZORAL) 2 % shampoo Apply 1 application topically 2 (two) times a week. 120 mL 2  . albuterol (PROVENTIL HFA;VENTOLIN HFA) 108 (90 BASE) MCG/ACT inhaler Inhale 2 puffs into the lungs every 6 (six) hours as needed.    . cetirizine (ZYRTEC) 10 MG tablet Take 10 mg by mouth daily.    . fluticasone (FLONASE) 50 MCG/ACT nasal spray Place 2 sprays into the nose daily.     No current facility-administered medications on file prior to visit.     ROS ROS otherwise unremarkable unless listed above.   Physical Examination: BP 118/76 (BP Location: Right Arm, Patient Position: Sitting, Cuff Size: Small)   Pulse 84   Temp 98.8 F (37.1 C) (Oral)   Ht 5' 9.75" (1.772 m)   Wt 187 lb 12.8 oz (85.2 kg)   SpO2 98%   BMI 27.14 kg/m  Ideal Body Weight: Weight in (lb) to have BMI = 25: 172.6  Physical Exam  Constitutional: He is oriented to person, place, and time. He appears well-developed and well-nourished. No distress.  HENT:  Head: Normocephalic and atraumatic.  Right Ear:  Tympanic membrane, external ear and ear canal normal.  Left Ear: Tympanic membrane, external ear and ear canal normal.  Nose: Mucosal edema and rhinorrhea present. Right sinus exhibits no maxillary sinus tenderness and no frontal sinus tenderness. Left sinus exhibits no maxillary sinus tenderness and no frontal sinus tenderness.  Mouth/Throat: No uvula swelling. No oropharyngeal exudate, posterior oropharyngeal edema or posterior oropharyngeal erythema.  Eyes: Conjunctivae, EOM and lids are normal. Pupils are equal, round, and reactive to light. Right eye exhibits normal extraocular motion. Left eye exhibits normal extraocular motion.  Neck: Trachea normal and full passive range of motion without pain. No edema and no erythema present.   Cardiovascular: Normal rate.   Pulmonary/Chest: Effort normal. No respiratory distress. He has no decreased breath sounds. He has no wheezes. He has no rhonchi.  Neurological: He is alert and oriented to person, place, and time.  Skin: Skin is warm and dry. He is not diaphoretic.  Psychiatric: He has a normal mood and affect. His behavior is normal.     Assessment and Plan: YADIER MARLAND is a 71 y.o. male who is here today for cc of body aches and cough suddenly in 1 day.  This appears more flu.   Will treat.  Advised to return within 48-72 hours, if fever does not improve.  Encouraged anti-pyretic use, and supportive treatment.  Declines cough syrup at this time.   Flu-like symptoms - Plan: oseltamivir (TAMIFLU) 75 MG capsule, benzonatate (TESSALON) 100 MG capsule, Guaifenesin (MUCINEX MAXIMUM STRENGTH) 1200 MG TB12  Ivar Drape, PA-C [facility] 1/28/20187:27 PM

## 2016-12-26 NOTE — Patient Instructions (Addendum)
Continue to use the corticetin.   Take medication as prescribed.    Influenza, Adult Influenza ("the flu") is an infection in the lungs, nose, and throat (respiratory tract). It is caused by a virus. The flu causes many common cold symptoms, as well as a high fever and body aches. It can make you feel very sick. The flu spreads easily from person to person (is contagious). Getting a flu shot (influenza vaccination) every year is the best way to prevent the flu. Follow these instructions at home:  Take over-the-counter and prescription medicines only as told by your doctor.  Use a cool mist humidifier to add moisture (humidity) to the air in your home. This can make it easier to breathe.  Rest as needed.  Drink enough fluid to keep your pee (urine) clear or pale yellow.  Cover your mouth and nose when you cough or sneeze.  Wash your hands with soap and water often, especially after you cough or sneeze. If you cannot use soap and water, use hand sanitizer.  Stay home from work or school as told by your doctor. Unless you are visiting your doctor, try to avoid leaving home until your fever has been gone for 24 hours without the use of medicine.  Keep all follow-up visits as told by your doctor. This is important. How is this prevented?  Getting a yearly (annual) flu shot is the best way to avoid getting the flu. You may get the flu shot in late summer, fall, or winter. Ask your doctor when you should get your flu shot.  Wash your hands often or use hand sanitizer often.  Avoid contact with people who are sick during cold and flu season.  Eat healthy foods.  Drink plenty of fluids.  Get enough sleep.  Exercise regularly. Contact a doctor if:  You get new symptoms.  You have:  Chest pain.  Watery poop (diarrhea).  A fever.  Your cough gets worse.  You start to have more mucus.  You feel sick to your stomach (nauseous).  You throw up (vomit). Get help right away  if:  You start to be short of breath or have trouble breathing.  Your skin or nails turn a bluish color.  You have very bad pain or stiffness in your neck.  You get a sudden headache.  You get sudden pain in your face or ear.  You cannot stop throwing up. This information is not intended to replace advice given to you by your health care provider. Make sure you discuss any questions you have with your health care provider. Document Released: 08/26/2008 Document Revised: 04/24/2016 Document Reviewed: 09/11/2015 Elsevier Interactive Patient Education  2017 [employer].     IF you received an x-ray today, you will receive an invoice from [facility] Radiology. Please contact [facility] at [phone] with questions or concerns regarding your invoice.   IF you received labwork today, you will receive an invoice from [place]. Please contact [facility] at [phone] with questions or concerns regarding your invoice.   Our billing staff will not be able to assist you with questions regarding bills from these companies.  You will be contacted with the lab results as soon as they are available. The fastest way to get your results is to activate your My Chart account. Instructions are located on the last page of this paperwork. If you have not heard from [place] regarding the results in 2 weeks, please contact this office.

## 2017-01-01 ENCOUNTER — Encounter: Payer: Self-pay | Admitting: Family Medicine

## 2017-01-01 ENCOUNTER — Ambulatory Visit (INDEPENDENT_AMBULATORY_CARE_PROVIDER_SITE_OTHER): Payer: Federal, State, Local not specified - PPO | Admitting: Family Medicine

## 2017-01-01 VITALS — BP 134/90 | HR 78 | Temp 98.3°F | Resp 16 | Ht 69.0 in | Wt 189.0 lb

## 2017-01-01 DIAGNOSIS — R69 Illness, unspecified: Secondary | ICD-10-CM

## 2017-01-01 DIAGNOSIS — J111 Influenza due to unidentified influenza virus with other respiratory manifestations: Secondary | ICD-10-CM

## 2017-01-01 DIAGNOSIS — R05 Cough: Secondary | ICD-10-CM

## 2017-01-01 DIAGNOSIS — H6122 Impacted cerumen, left ear: Secondary | ICD-10-CM | POA: Diagnosis not present

## 2017-01-01 DIAGNOSIS — R059 Cough, unspecified: Secondary | ICD-10-CM

## 2017-01-01 NOTE — Patient Instructions (Addendum)
Your lungs sound okay today. Cough should continue to improve. If any return of fever or worsening cough/shortness of breath, return for recheck.  The ear fullness I believe was just due to ear wax impaction. That should be okay from here forward. If you have persistent congestion, ear pain, or any difficulty hearing out of that ear, return for recheck.   Earwax Buildup Your ears make a substance called earwax. It may also be called cerumen. Sometimes, too much earwax builds up in your ear canal. This can cause ear pain and make it harder for you to hear. CAUSES This condition is caused by too much earwax production or buildup. RISK FACTORS The following factors may make you more likely to develop this condition:  Cleaning your ears often with swabs.  Having narrow ear canals.  Having earwax that is overly thick or sticky.  Having eczema.  Being dehydrated. SYMPTOMS Symptoms of this condition include:  Reduced hearing.  Ear drainage.  Ear pain.  Ear itch.  A feeling of fullness in the ear or feeling that the ear is plugged.  Ringing in the ear.  Coughing. DIAGNOSIS Your health care provider can diagnose this condition based on your symptoms and medical history. Your health care provider will also do an ear exam to look inside your ear with a scope (otoscope). You may also have a hearing test. TREATMENT Treatment for this condition includes:  Over-the-counter or prescription ear drops to soften the earwax.  Earwax removal by a health care provider. This may be done:  By flushing the ear with body-temperature water.  With a medical instrument that has a loop at the end (earwax curette).  With a suction device. HOME CARE INSTRUCTIONS  Take over-the-counter and prescription medicines only as told by your health care provider.  Do not put any objects, including an ear swab, into your ear. You can clean the opening of your ear canal with a washcloth.  Drink enough  water to keep your urine clear or pale yellow.  If you have frequent earwax buildup or you use hearing aids, consider seeing your health care provider every 6-12 months for routine preventive ear cleanings. Keep all follow-up visits as told by your health care provider. SEEK MEDICAL CARE IF:  You have ear pain.  Your condition does not improve with treatment.  You have hearing loss.  You have blood, pus, or other fluid coming from your ear. This information is not intended to replace advice given to you by your health care provider. Make sure you discuss any questions you have with your health care provider. Document Released: 12/25/2004 Document Revised: 03/10/2016 Document Reviewed: 07/04/2015 Elsevier Interactive Patient Education  2017 [employer].   IF you received an x-ray today, you will receive an invoice from [facility] Radiology. Please contact [facility] Radiology at [phone] with questions or concerns regarding your invoice.   IF you received labwork today, you will receive an invoice from [place]. Please contact [facility] at [phone] with questions or concerns regarding your invoice.   Our billing staff will not be able to assist you with questions regarding bills from these companies.  You will be contacted with the lab results as soon as they are available. The fastest way to get your results is to activate your My Chart account. Instructions are located on the last page of this paperwork. If you have not heard from [place] regarding the results in 2 weeks, please contact this office.

## 2017-01-01 NOTE — Progress Notes (Addendum)
By signing my name below, I, Austin Watkins, attest that this documentation has been prepared under the direction and in the presence of Austin Ray, MD.  Electronically Signed: Verlee Watkins, Medical Scribe. 01/01/17. 3:38 PM.  Subjective:    Patient ID: Austin Watkins, male    DOB: Dec 08, 1945, 71 y.o.   MRN: [id number]  HPI Chief Complaint  Patient presents with  . Follow-up  . flu    states he is doing better; only coughing a little  . ear issue    states he noticed this morning that his left ear may have fluid in it    HPI Comments: Austin Watkins is a 71 y.o. male who presents to the [facility] for follow-up. He was seen Jan 26 for influenza like illness with fever, myalgias, and cough for 1 day. He was treated with tamiflu, tessalon, and mucinex.  Flu follow-up: Pt still coughs up sputum in the morning, but otherwise free of his other initial sxs and he feels better over all. Pt didn't experience any side effects from his medication and felt better after taking them.  Ear Pain: Pt reports left ear pain that's described as a fullness. Denies hearing loss. No pain on outside or discharge. No attempted treatments.   Patient Active Problem List   Diagnosis Date Noted  . Cellulitis of foot, right 04/25/2014  . Closed fracture of fifth metatarsal bone of right foot 04/18/2014  . Closed fracture of the right second, third, and fifth proximal phalanges of the foot 04/18/2014  . Lipid disorder 03/29/2012  . Allergy history unknown 03/08/2012  . Bronchospasm 03/08/2012   Past Medical History:  Diagnosis Date  . Allergy   . Hyperlipidemia    Past Surgical History:  Procedure Laterality Date  . knee scoped     Allergies  Allergen Reactions  . Neosporin [Neomycin-Bacitracin Zn-Polymyx] Other (See Comments)    Triple antibiotic - rash on skin   Prior to Admission medications   Medication Sig Start Date End Date Taking? Authorizing Provider  albuterol  (PROVENTIL HFA;VENTOLIN HFA) 108 (90 BASE) MCG/ACT inhaler Inhale 2 puffs into the lungs every 6 (six) hours as needed.    Historical Provider, MD  aspirin 81 MG tablet Take 81 mg by mouth daily.    Historical Provider, MD  atorvastatin (LIPITOR) 10 MG tablet Take 1 tablet (10 mg total) by mouth daily. 11/13/16   Wendie Agreste, MD  benzonatate (TESSALON) 100 MG capsule Take 1-2 capsules (100-200 mg total) by mouth 3 (three) times daily as needed for cough. 12/26/16   Dorian Heckle English, PA  cetirizine (ZYRTEC) 10 MG tablet Take 10 mg by mouth daily.    Historical Provider, MD  fluticasone (FLONASE) 50 MCG/ACT nasal spray Place 2 sprays into the nose daily.    Historical Provider, MD  Guaifenesin (MUCINEX MAXIMUM STRENGTH) 1200 MG TB12 Take 1 tablet (1,200 mg total) by mouth every 12 (twelve) hours as needed. 12/26/16   Dorian Heckle English, PA  ketoconazole (NIZORAL) 2 % shampoo Apply 1 application topically 2 (two) times a week. 11/13/16   Wendie Agreste, MD  oseltamivir (TAMIFLU) 75 MG capsule Take 1 capsule (75 mg total) by mouth 2 (two) times daily. 12/26/16   Joretta Bachelor, PA   Social History   Social History  . Marital status: Married    Spouse name: N/A  . Number of children: N/A  . Years of education: N/A   Occupational History  .  Retired    Social History Main Topics  . Smoking status: Never Smoker  . Smokeless tobacco: Never Used  . Alcohol use No  . Drug use: No  . Sexual activity: Not on file   Other Topics Concern  . Not on file   Social History Narrative   Married      Review of Systems  HENT: Positive for ear pain. Negative for ear discharge and hearing loss.   Respiratory: Positive for cough.    Objective:  Physical Exam  Constitutional: He is oriented to person, place, and time. He appears well-developed and well-nourished.  HENT:  Head: Normocephalic and atraumatic.  Right Ear: Tympanic membrane and external ear normal.  Left Ear: External ear  and ear canal normal. No drainage.  Nose: No rhinorrhea.  Mouth/Throat: Oropharynx is clear and moist and mucous membranes are normal. No oropharyngeal exudate or posterior oropharyngeal erythema.  Moderate cerumen on the right canal, not obstructed TM pearly grey Left ear pinna non tender Moderate cerumen, unable to visualize TM Some dark yellow/brown cerumen was removed without difficulty with white curette, still significant cerumen proximately. No discharge After lavage (4:36 PM): TM pearly grey, canal clear  Eyes: Conjunctivae are normal. Pupils are equal, round, and reactive to light.  Neck: Neck supple.  Cardiovascular: Normal rate, regular rhythm, normal heart sounds and intact distal pulses.  Exam reveals no friction rub.   No murmur heard. Pulmonary/Chest: Effort normal and breath sounds normal. No respiratory distress. He has no wheezes. He has no rhonchi. He has no rales.  Abdominal: Soft. There is no tenderness.  Lymphadenopathy:    He has no cervical adenopathy.  Neurological: He is alert and oriented to person, place, and time.  Skin: Skin is warm and dry. No rash noted.  Psychiatric: He has a normal mood and affect. His behavior is normal.  Vitals reviewed.  BP 134/90   Pulse 78   Temp 98.3 F (36.8 C) (Oral)   Resp 16   Ht 5\' 9"  (1.753 m)   Wt 189 lb (85.7 kg)   SpO2 98%   BMI 27.91 kg/m   Assessment & Plan:   Austin Watkins is a 71 y.o. male Cough Influenza-like illness  - improved after Tamiflu. Likely some postinfectious cough. Continue Symptomatic care and RTC precautions  Impacted cerumen of left ear - Plan: Ear wax removal  -Cerumen manually removed me as above, then lavage by clinical staff. Resolution of symptoms, no residual hearing deficit or discomfort. RTC precautions discussed.  No orders of the defined types were placed in this encounter.  Patient Instructions   Your lungs sound okay today. Cough should continue to improve. If any return of  fever or worsening cough/shortness of breath, return for recheck.  The ear fullness I believe was just due to ear wax impaction. That should be okay from here forward. If you have persistent congestion, ear pain, or any difficulty hearing out of that ear, return for recheck.   Earwax Buildup Your ears make a substance called earwax. It may also be called cerumen. Sometimes, too much earwax builds up in your ear canal. This can cause ear pain and make it harder for you to hear. CAUSES This condition is caused by too much earwax production or buildup. RISK FACTORS The following factors may make you more likely to develop this condition:  Cleaning your ears often with swabs.  Having narrow ear canals.  Having earwax that is overly thick or sticky.  Having  eczema.  Being dehydrated. SYMPTOMS Symptoms of this condition include:  Reduced hearing.  Ear drainage.  Ear pain.  Ear itch.  A feeling of fullness in the ear or feeling that the ear is plugged.  Ringing in the ear.  Coughing. DIAGNOSIS Your health care provider can diagnose this condition based on your symptoms and medical history. Your health care provider will also do an ear exam to look inside your ear with a scope (otoscope). You may also have a hearing test. TREATMENT Treatment for this condition includes:  Over-the-counter or prescription ear drops to soften the earwax.  Earwax removal by a health care provider. This may be done:  By flushing the ear with body-temperature water.  With a medical instrument that has a loop at the end (earwax curette).  With a suction device. HOME CARE INSTRUCTIONS  Take over-the-counter and prescription medicines only as told by your health care provider.  Do not put any objects, including an ear swab, into your ear. You can clean the opening of your ear canal with a washcloth.  Drink enough water to keep your urine clear or pale yellow.  If you have frequent earwax  buildup or you use hearing aids, consider seeing your health care provider every 6-12 months for routine preventive ear cleanings. Keep all follow-up visits as told by your health care provider. SEEK MEDICAL CARE IF:  You have ear pain.  Your condition does not improve with treatment.  You have hearing loss.  You have blood, pus, or other fluid coming from your ear. This information is not intended to replace advice given to you by your health care provider. Make sure you discuss any questions you have with your health care provider. Document Released: 12/25/2004 Document Revised: 03/10/2016 Document Reviewed: 07/04/2015 Elsevier Interactive Patient Education  2017 [employer].   IF you received an x-Watkins today, you will receive an invoice from [facility] Radiology. Please contact [facility] Radiology at [phone] with questions or concerns regarding your invoice.   IF you received labwork today, you will receive an invoice from [place] [facility]. Please contact [facility] at [phone] with questions or concerns regarding your invoice.   Our billing staff will not be able to assist you with questions regarding bills from these companies.  You will be contacted with the lab results as soon as they are available. The fastest way to get your results is to activate your My Chart account. Instructions are located on the last page of this paperwork. If you have not heard from [place] regarding the results in 2 weeks, please contact this office.       I personally performed the services described in this documentation, which was scribed in my presence. The recorded information has been reviewed and considered for accuracy and completeness, addended by me as needed, and agree with information above.  Signed,   Austin Ray, MD [facility] [place].  01/02/17 10:25 PM

## 2017-11-19 ENCOUNTER — Ambulatory Visit (INDEPENDENT_AMBULATORY_CARE_PROVIDER_SITE_OTHER): Payer: Federal, State, Local not specified - PPO | Admitting: Family Medicine

## 2017-11-19 ENCOUNTER — Other Ambulatory Visit: Payer: Self-pay

## 2017-11-19 ENCOUNTER — Encounter: Payer: Self-pay | Admitting: Family Medicine

## 2017-11-19 VITALS — BP 148/86 | HR 68 | Temp 97.7°F | Resp 18 | Ht 69.0 in | Wt 188.0 lb

## 2017-11-19 DIAGNOSIS — H6122 Impacted cerumen, left ear: Secondary | ICD-10-CM

## 2017-11-19 DIAGNOSIS — Z125 Encounter for screening for malignant neoplasm of prostate: Secondary | ICD-10-CM | POA: Diagnosis not present

## 2017-11-19 DIAGNOSIS — L219 Seborrheic dermatitis, unspecified: Secondary | ICD-10-CM | POA: Diagnosis not present

## 2017-11-19 DIAGNOSIS — E785 Hyperlipidemia, unspecified: Secondary | ICD-10-CM | POA: Diagnosis not present

## 2017-11-19 DIAGNOSIS — Z0000 Encounter for general adult medical examination without abnormal findings: Secondary | ICD-10-CM | POA: Diagnosis not present

## 2017-11-19 MED ORDER — ATORVASTATIN CALCIUM 10 MG PO TABS: 10.0000 mg | ORAL_TABLET | Freq: Every day | ORAL | 3 refills | Status: DC

## 2017-11-19 MED ORDER — KETOCONAZOLE 2 % EX SHAM: 1.0000 "application " | MEDICATED_SHAMPOO | CUTANEOUS | 2 refills | Status: DC

## 2017-11-19 NOTE — Progress Notes (Signed)
Subjective:  By signing my name below, I, [name], attest that this documentation has been prepared under the direction and in the presence of Wendie Agreste, MD Electronically Signed: Ladene Artist, ED Scribe 11/19/2017 at 9:51 AM.   Patient ID: Austin Watkins, male    DOB: 05-19-1946, 71 y.o.   MRN: [id number]  Chief Complaint  Patient presents with  . Annual Exam   HPI Austin Watkins is a 71 y.o. male who presents to Primary Care at [facility] for an annual wellness visit. H/o hyperlipidemia, allergic rhinitis, seborrheic dermatitis.  Seborrheic Dermatitis Nizoral twice/week.  Hyperlipidemia Lab Results  Component Value Date   CHOL 210 (H) 11/13/2016   HDL 65 11/13/2016   LDLCALC 113 (H) 11/13/2016   TRIG 160 (H) 11/13/2016   CHOLHDL 3.2 11/13/2016   Lab Results  Component Value Date   ALT 33 11/13/2016   AST 27 11/13/2016   ALKPHOS 93 11/13/2016   BILITOT 0.9 11/13/2016  Lipitor 10 mg qd. LDL was slightly high last visit but decided to remain at same dose of Lipitor.  Allergic Rhinitis  OTC Zyrtec and Flonase PRN. Has noticed a clogged sensation in the L ear, similar to last year when his ear was impacted with cerumen.   Screening Labs Pt had screening labs done 9/25 by Dr. Collene Mares with normal CBC, normal BMP with egfr 91 and normal hepatic profile.  CA Screening Colonoscopy: reported at last visit that he had 1 at age 13, should be due currently. Scheduled 1/9 with Dr. Collene Mares. Prostate CA Screening: no family hx Lab Results  Component Value Date   PSA 2.12 11/05/2015   PSA 1.89 09/04/2014   PSA 1.48 03/28/2013  Skin: followed by dermatology once a year  Immunizations Immunization History  Administered Date(s) Administered  . Td 12/02/2007  Declined/refused immunizations in the past, specifically shingles, pneumonia and flu vaccines. Declines again at this visit.  Fall Screening Denies falls within the past year  Depression Screening Depression screen  [facility] 2/9 11/19/2017 12/26/2016 11/13/2016 11/05/2015 09/04/2014  Decreased Interest 0 0 0 0 0  Down, Depressed, Hopeless 0 0 0 0 0  PHQ - 2 Score 0 0 0 0 0   Functional Status Survey: Is the patient deaf or have difficulty hearing?: No Does the patient have difficulty seeing, even when wearing glasses/contacts?: No Does the patient have difficulty concentrating, remembering, or making decisions?: No Does the patient have difficulty walking or climbing stairs?: No Does the patient have difficulty dressing or bathing?: No Does the patient have difficulty doing errands alone such as visiting a doctor's office or shopping?: No  Mental Status Screening 6CIT Screen 11/19/2017  What Year? 0 points  What month? 0 points  What time? 0 points  Count back from 20 0 points  Months in reverse 0 points  Repeat phrase 0 points  Total Score 0    Visual Acuity Screening   Right eye Left eye Both eyes  Without correction: '20/40 20/40 20/25 '$  With correction:     Slight change from 20/25 in both eyes individually last year  Vision: wears glasses, last seen 03/2017 Dentist: regularly Exercise: walks 50-60 minutes/day for exercise  Home Exercise  11/19/2017  Current Exercise Habits Home exercise routine  Type of exercise walking  Time (Minutes) 60  Frequency (Times/Week) 7  Weekly Exercise (Minutes/Week) 420  Intensity Mild  Exercise limited by: None identified   Advanced Directives Has a living will. A copy was requested.  Patient  Active Problem List   Diagnosis Date Noted  . Cellulitis of foot, right 04/25/2014  . Closed fracture of fifth metatarsal bone of right foot 04/18/2014  . Closed fracture of the right second, third, and fifth proximal phalanges of the foot 04/18/2014  . Lipid disorder 03/29/2012  . Allergy history unknown 03/08/2012  . Bronchospasm 03/08/2012   Past Medical History:  Diagnosis Date  . Allergy   . Cataract   . Hyperlipidemia    Past Surgical History:    Procedure Laterality Date  . knee scoped     Allergies  Allergen Reactions  . Neosporin [Neomycin-Bacitracin Zn-Polymyx] Other (See Comments)    Triple antibiotic - rash on skin   Prior to Admission medications   Medication Sig Start Date End Date Taking? Authorizing Provider  aspirin 81 MG tablet Take 81 mg by mouth daily.   Yes [provider]  atorvastatin (LIPITOR) 10 MG tablet Take 1 tablet (10 mg total) by mouth daily. 11/13/16  Yes Wendie Agreste, MD  ketoconazole (NIZORAL) 2 % shampoo Apply 1 application topically 2 (two) times a week. 11/13/16  Yes Wendie Agreste, MD  Multiple Vitamins-Minerals (MULTIVITAMIN ADULT) TABS Take by mouth.   Yes [provider]  benzonatate (TESSALON) 100 MG capsule Take 1-2 capsules (100-200 mg total) by mouth 3 (three) times daily as needed for cough. Patient not taking: Reported on 11/19/2017 12/26/16   Ivar Drape D, PA  Guaifenesin [facility] MAXIMUM STRENGTH) 1200 MG TB12 Take 1 tablet (1,200 mg total) by mouth every 12 (twelve) hours as needed. Patient not taking: Reported on 11/19/2017 12/26/16   Joretta Bachelor, PA   Social History   Socioeconomic History  . Marital status: Married    Spouse name: Not on file  . Number of children: Not on file  . Years of education: Not on file  . Highest education level: Not on file  Social Needs  . Financial resource strain: Not on file  . Food insecurity - worry: Not on file  . Food insecurity - inability: Not on file  . Transportation needs - medical: Not on file  . Transportation needs - non-medical: Not on file  Occupational History  . Occupation: Retired  Tobacco Use  . Smoking status: Never Smoker  . Smokeless tobacco: Never Used  Substance and Sexual Activity  . Alcohol use: No  . Drug use: No  . Sexual activity: Not on file  Other Topics Concern  . Not on file  Social History Narrative   Married   Review of Systems  Cardiovascular: Negative for  palpitations.      Objective:   Physical Exam  Constitutional: He is oriented to person, place, and time. He appears well-developed and well-nourished.  HENT:  Head: Normocephalic and atraumatic.  Right Ear: External ear normal.  Left Ear: External ear normal.  Mouth/Throat: Oropharynx is clear and moist.  Ears: Moderate cerumen on the R but not obstructed. Obstruction with dark yellow cerumen on the L.  Eyes: Conjunctivae and EOM are normal. Pupils are equal, round, and reactive to light.  Neck: Normal range of motion. Neck supple. No thyromegaly present.  Cardiovascular: Normal rate, regular rhythm, normal heart sounds and intact distal pulses.  Rare ectopic beat  Pulmonary/Chest: Effort normal and breath sounds normal. No respiratory distress. He has no wheezes.  Abdominal: Soft. He exhibits no distension. There is no tenderness. Hernia confirmed negative in the right inguinal area and confirmed negative in the left inguinal area.  Genitourinary: Prostate normal.  Musculoskeletal: Normal range of motion. He exhibits no edema or tenderness.  Lymphadenopathy:    He has no cervical adenopathy.  Neurological: He is alert and oriented to person, place, and time. He has normal reflexes.  Skin: Skin is warm and dry.  Psychiatric: He has a normal mood and affect. His behavior is normal.  Vitals reviewed.  Vitals:   11/19/17 0935  BP: (!) 148/86  Pulse: 68  Resp: 18  Temp: 97.7 F (36.5 C)  TempSrc: Oral  SpO2: 100%  Weight: 188 lb (85.3 kg)  Height: '5\' 9"'$  (1.753 m)      Assessment & Plan:    Austin Watkins is a 71 y.o. male Annual physical exam  -  - anticipatory guidance as below in AVS, screening labs if needed. Health maintenance items as above in HPI discussed/recommended as applicable.   - no concerning responses on depression, fall, or functional status screening. Any positive responses noted as above. Advanced directives discussed as in CHL.   - Routine immunizations  recommended, but were again declined. Website link to [employer] for more information provided.  Hyperlipidemia, unspecified hyperlipidemia type - Plan: Lipid panel, atorvastatin (LIPITOR) 10 MG tablet  - Stable on current dose of Lipitor, tolerating without new side effects, mild elevation in prior LDL. Repeat labs. I did review normal LFTs from September at his gastroenterologist.  Seborrheic dermatitis of scalp - Plan: ketoconazole (NIZORAL) 2 % shampoo  - Stable with Nizoral shampoo twice per week. Continue same.  Screening for prostate cancer - Plan: PSA  - We discussed pros and cons of prostate cancer screening, and after this discussion, he chose to have screening done. PSA obtained, and no concerning findings on DRE.   Impacted cerumen of left ear - Plan: Ear wax removal  - impacted with difficulty hearing. Removed with in office lavage.  Borderline elevated blood pressure, monitor at home and if remaining elevated, return to discuss potential medication. Based on JNC 8, could allow some 140 readings.  Meds ordered this encounter  Medications  . ketoconazole (NIZORAL) 2 % shampoo    Sig: Apply 1 application topically 2 (two) times a week.    Dispense:  120 mL    Refill:  2  . atorvastatin (LIPITOR) 10 MG tablet    Sig: Take 1 tablet (10 mg total) by mouth daily.    Dispense:  90 tablet    Refill:  3   Patient Instructions   Keep follow up with gastroenterology as planned. No change in meds for now.   If you change your mind about vaccines, I am happy to give those to you here and I do recommend them.  Here is a link for more information: [email or web address].pl  Keep a record of your blood pressures outside of the office and if remaining over 140/90 most of the time - return to discuss possible meds.  Thanks for coming in today. Follow up in 1 year if labs are stable.    Preventive Care 69 Years and Older, Male Preventive care refers to lifestyle  choices and visits with your health care provider that can promote health and wellness. What does preventive care include?  A yearly physical exam. This is also called an annual well check.  Dental exams once or twice a year.  Routine eye exams. Ask your health care provider how often you should have your eyes checked.  Personal lifestyle choices, including: ? Daily care of your teeth and gums. ?  Regular physical activity. ? Eating a healthy diet. ? Avoiding tobacco and drug use. ? Limiting alcohol use. ? Practicing safe sex. ? Taking low doses of aspirin every day. ? Taking vitamin and mineral supplements as recommended by your health care provider. What happens during an annual well check? The services and screenings done by your health care provider during your annual well check will depend on your age, overall health, lifestyle risk factors, and family history of disease. Counseling Your health care provider may ask you questions about your:  Alcohol use.  Tobacco use.  Drug use.  Emotional well-being.  Home and relationship well-being.  Sexual activity.  Eating habits.  History of falls.  Memory and ability to understand (cognition).  Work and work Statistician.  Screening You may have the following tests or measurements:  Height, weight, and BMI.  Blood pressure.  Lipid and cholesterol levels. These may be checked every 5 years, or more frequently if you are over 45 years old.  Skin check.  Lung cancer screening. You may have this screening every year starting at age 82 if you have a 30-pack-year history of smoking and currently smoke or have quit within the past 15 years.  Fecal occult blood test (FOBT) of the stool. You may have this test every year starting at age 55.  Flexible sigmoidoscopy or colonoscopy. You may have a sigmoidoscopy every 5 years or a colonoscopy every 10 years starting at age 71.  Prostate cancer screening. Recommendations will  vary depending on your family history and other risks.  Hepatitis C blood test.  Hepatitis B blood test.  Sexually transmitted disease (STD) testing.  Diabetes screening. This is done by checking your blood sugar (glucose) after you have not eaten for a while (fasting). You may have this done every 1-3 years.  Abdominal aortic aneurysm (AAA) screening. You may need this if you are a current or former smoker.  Osteoporosis. You may be screened starting at age 36 if you are at high risk.  Talk with your health care provider about your test results, treatment options, and if necessary, the need for more tests. Vaccines Your health care provider may recommend certain vaccines, such as:  Influenza vaccine. This is recommended every year.  Tetanus, diphtheria, and acellular pertussis (Tdap, Td) vaccine. You may need a Td booster every 10 years.  Varicella vaccine. You may need this if you have not been vaccinated.  Zoster vaccine. You may need this after age 3.  Measles, mumps, and rubella (MMR) vaccine. You may need at least one dose of MMR if you were born in 1957 or later. You may also need a second dose.  Pneumococcal 13-valent conjugate (PCV13) vaccine. One dose is recommended after age 53.  Pneumococcal polysaccharide (PPSV23) vaccine. One dose is recommended after age 63.  Meningococcal vaccine. You may need this if you have certain conditions.  Hepatitis A vaccine. You may need this if you have certain conditions or if you travel or work in places where you may be exposed to hepatitis A.  Hepatitis B vaccine. You may need this if you have certain conditions or if you travel or work in places where you may be exposed to hepatitis B.  Haemophilus influenzae type b (Hib) vaccine. You may need this if you have certain risk factors.  Talk to your health care provider about which screenings and vaccines you need and how often you need them. This information is not intended to  replace advice given to  you by your health care provider. Make sure you discuss any questions you have with your health care provider. Document Released: 12/14/2015 Document Revised: 08/06/2016 Document Reviewed: 09/18/2015 Elsevier Interactive Patient Education  2018 [employer].    IF you received an x-ray today, you will receive an invoice from [facility]. Please contact [facility] at [phone] with questions or concerns regarding your invoice.   IF you received labwork today, you will receive an invoice from [place]. Please contact [facility] at [phone] with questions or concerns regarding your invoice.   Our billing staff will not be able to assist you with questions regarding bills from these companies.  You will be contacted with the lab results as soon as they are available. The fastest way to get your results is to activate your My Chart account. Instructions are located on the last page of this paperwork. If you have not heard from [place] regarding the results in 2 weeks, please contact this office.      I personally performed the services described in this documentation, which was scribed in my presence. The recorded information has been reviewed and considered for accuracy and completeness, addended by me as needed, and agree with information above.  Signed,   Merri Ray, MD Primary Care at [facility].  11/19/17 10:29 AM

## 2017-11-19 NOTE — Patient Instructions (Addendum)
Keep follow up with gastroenterology as planned. No change in meds for now.   If you change your mind about vaccines, I am happy to give those to you here and I do recommend them.  Here is a link for more information: [email or web address].pl  Keep a record of your blood pressures outside of the office and if remaining over 140/90 most of the time - return to discuss possible meds.  Thanks for coming in today. Follow up in 1 year if labs are stable.    Preventive Care 50 Years and Older, Male Preventive care refers to lifestyle choices and visits with your health care provider that can promote health and wellness. What does preventive care include?  A yearly physical exam. This is also called an annual well check.  Dental exams once or twice a year.  Routine eye exams. Ask your health care provider how often you should have your eyes checked.  Personal lifestyle choices, including: ? Daily care of your teeth and gums. ? Regular physical activity. ? Eating a healthy diet. ? Avoiding tobacco and drug use. ? Limiting alcohol use. ? Practicing safe sex. ? Taking low doses of aspirin every day. ? Taking vitamin and mineral supplements as recommended by your health care provider. What happens during an annual well check? The services and screenings done by your health care provider during your annual well check will depend on your age, overall health, lifestyle risk factors, and family history of disease. Counseling Your health care provider may ask you questions about your:  Alcohol use.  Tobacco use.  Drug use.  Emotional well-being.  Home and relationship well-being.  Sexual activity.  Eating habits.  History of falls.  Memory and ability to understand (cognition).  Work and work Statistician.  Screening You may have the following tests or measurements:  Height, weight, and BMI.  Blood pressure.  Lipid and cholesterol levels. These may be  checked every 5 years, or more frequently if you are over 26 years old.  Skin check.  Lung cancer screening. You may have this screening every year starting at age 47 if you have a 30-pack-year history of smoking and currently smoke or have quit within the past 15 years.  Fecal occult blood test (FOBT) of the stool. You may have this test every year starting at age 42.  Flexible sigmoidoscopy or colonoscopy. You may have a sigmoidoscopy every 5 years or a colonoscopy every 10 years starting at age 47.  Prostate cancer screening. Recommendations will vary depending on your family history and other risks.  Hepatitis C blood test.  Hepatitis B blood test.  Sexually transmitted disease (STD) testing.  Diabetes screening. This is done by checking your blood sugar (glucose) after you have not eaten for a while (fasting). You may have this done every 1-3 years.  Abdominal aortic aneurysm (AAA) screening. You may need this if you are a current or former smoker.  Osteoporosis. You may be screened starting at age 109 if you are at high risk.  Talk with your health care provider about your test results, treatment options, and if necessary, the need for more tests. Vaccines Your health care provider may recommend certain vaccines, such as:  Influenza vaccine. This is recommended every year.  Tetanus, diphtheria, and acellular pertussis (Tdap, Td) vaccine. You may need a Td booster every 10 years.  Varicella vaccine. You may need this if you have not been vaccinated.  Zoster vaccine. You may need this after age 68.  Measles, mumps, and rubella (MMR) vaccine. You may need at least one dose of MMR if you were born in 1957 or later. You may also need a second dose.  Pneumococcal 13-valent conjugate (PCV13) vaccine. One dose is recommended after age 60.  Pneumococcal polysaccharide (PPSV23) vaccine. One dose is recommended after age 72.  Meningococcal vaccine. You may need this if you have  certain conditions.  Hepatitis A vaccine. You may need this if you have certain conditions or if you travel or work in places where you may be exposed to hepatitis A.  Hepatitis B vaccine. You may need this if you have certain conditions or if you travel or work in places where you may be exposed to hepatitis B.  Haemophilus influenzae type b (Hib) vaccine. You may need this if you have certain risk factors.  Talk to your health care provider about which screenings and vaccines you need and how often you need them. This information is not intended to replace advice given to you by your health care provider. Make sure you discuss any questions you have with your health care provider. Document Released: 12/14/2015 Document Revised: 08/06/2016 Document Reviewed: 09/18/2015 Elsevier Interactive Patient Education  2018 [employer].    IF you received an x-ray today, you will receive an invoice from [facility]. Please contact [facility]iology at [phone] with questions or concerns regarding your invoice.   IF you received labwork today, you will receive an invoice from [place]. Please contact [facility] at [phone] with questions or concerns regarding your invoice.   Our billing staff will not be able to assist you with questions regarding bills from these companies.  You will be contacted with the lab results as soon as they are available. The fastest way to get your results is to activate your My Chart account. Instructions are located on the last page of this paperwork. If you have not heard from [place] regarding the results in 2 weeks, please contact this office.

## 2017-11-20 LAB — PSA: PROSTATE SPECIFIC AG, SERUM: 2.4 ng/mL (ref 0.0–4.0)

## 2017-11-20 LAB — LIPID PANEL
Chol/HDL Ratio: 2.9 ratio (ref 0.0–5.0)
Cholesterol, Total: 161 mg/dL (ref 100–199)
HDL: 55 mg/dL (ref >39)
LDL Calculated: 86 mg/dL (ref 0–99)
Triglycerides: 98 mg/dL (ref 0–149)
VLDL Cholesterol Cal: 20 mg/dL (ref 5–40)

## 2017-12-09 LAB — HM COLONOSCOPY

## 2018-02-13 ENCOUNTER — Ambulatory Visit (INDEPENDENT_AMBULATORY_CARE_PROVIDER_SITE_OTHER): Payer: Federal, State, Local not specified - PPO

## 2018-02-13 ENCOUNTER — Other Ambulatory Visit: Payer: Self-pay

## 2018-02-13 ENCOUNTER — Encounter: Payer: Self-pay | Admitting: Family Medicine

## 2018-02-13 ENCOUNTER — Ambulatory Visit (INDEPENDENT_AMBULATORY_CARE_PROVIDER_SITE_OTHER): Payer: Federal, State, Local not specified - PPO | Admitting: Family Medicine

## 2018-02-13 VITALS — BP 148/84 | HR 71 | Temp 97.7°F | Resp 16 | Ht 69.45 in | Wt 190.4 lb

## 2018-02-13 DIAGNOSIS — R030 Elevated blood-pressure reading, without diagnosis of hypertension: Secondary | ICD-10-CM | POA: Diagnosis not present

## 2018-02-13 DIAGNOSIS — M5134 Other intervertebral disc degeneration, thoracic region: Secondary | ICD-10-CM

## 2018-02-13 DIAGNOSIS — M5030 Other cervical disc degeneration, unspecified cervical region: Secondary | ICD-10-CM

## 2018-02-13 DIAGNOSIS — S4382XA Sprain of other specified parts of left shoulder girdle, initial encounter: Secondary | ICD-10-CM

## 2018-02-13 MED ORDER — TRAMADOL HCL 50 MG PO TABS: 50.0000 mg | ORAL_TABLET | Freq: Three times a day (TID) | ORAL | 0 refills | Status: DC | PRN

## 2018-02-13 MED ORDER — CYCLOBENZAPRINE HCL 5 MG PO TABS: 5.0000 mg | ORAL_TABLET | Freq: Three times a day (TID) | ORAL | 1 refills | Status: DC | PRN

## 2018-02-13 NOTE — Patient Instructions (Addendum)
   IF you received an x-ray today, you will receive an invoice from [facility]. Please contact [facility] at [phone] with questions or concerns regarding your invoice.   IF you received labwork today, you will receive an invoice from [facility]. Please contact [facility] at [phone] with questions or concerns regarding your invoice.   Our billing staff will not be able to assist you with questions regarding bills from these companies.  You will be contacted with the lab results as soon as they are available. The fastest way to get your results is to activate your My Chart account. Instructions are located on the last page of this paperwork. If you have not heard from us regarding the results in 2 weeks, please contact this office.      Scapular Winging Rehab Ask your health care provider which exercises are safe for you. Do exercises exactly as told by your health care provider and adjust them as directed. It is normal to feel mild stretching, pulling, tightness, or discomfort as you do these exercises, but you should stop right away if you feel sudden pain or your pain gets worse.Do not begin these exercises until told by your health care provider. Stretching and range of motion exercises These exercises warm up your muscles and joints and improve the movement and flexibility of your shoulder. These exercises also help to relieve pain, numbness, and tingling. Exercise A: Pendulum  1. Stand near a wall or a surface that you can hold onto for balance. 2. Bend at the waist and let your left / right arm hang straight down. Use your other arm to support you. 3. Relax your arm and shoulder muscles, and move your hips and your trunk so your left / right arm swings freely. Your arm should swing because of the motion of your body, not because you are using your arm or shoulder muscles. 4. Keep moving so your arm swings in the following directions, as told by your health  care provider: ? Side to side. ? Forward and backward. ? In clockwise and counterclockwise circles. 5. Slowly return to the starting position. Repeat __________ times. Complete this exercise __________ times a day. Exercise B: Flexion, seated  1. Sit in a stable chair so your left / right forearm can rest on a flat surface. Your elbow should rest at a height that keeps your upper arm next to your body. 2. Keeping your shoulder relaxed, lean forward at the waist and let your hand slide forward. Stop when you feel a stretch in your shoulder, or when you reach the angle that is recommended by your health care provider. 3. Hold for __________ seconds. 4. Slowly return to the starting position. Repeat __________ times. Complete this exercise __________ times a day. Exercise C: Flexion, standing  1. Stand and hold a broomstick, a cane, or a similar object with your hands a little more than shoulder-width apart on the object. Your left / right hand should be palm-up, and your other hand should be palm-down. 2. Use both hands to raise the stick in front of your body and then over your head. Stop when you feel a stretch in your shoulder, or when you reach the angle that is recommended by your health care provider. ? Keep your left / right elbow straight and keep your shoulder muscles relaxed. ? Avoid shrugging your shoulder while you raise your arm. Keep your shoulder blade tucked down toward the middle of your spine. 3. Hold for __________   seconds. 4. Slowly return to the starting position. Repeat __________ times. Complete this exercise __________ times a day. Exercise D: Abduction, supine  1. Lie on your back and hold a broomstick, a cane, or a similar object. Place your hands a little more than shoulder-width apart on the object. Your left / right hand should be palm-up, and your other hand should be palm-down. 2. Push the stick to raise your left / right arm out to your side and then over your  head. Use your other hand to help move the stick. Stop when you feel a stretch in your shoulder, or when you reach the angle that is recommended by your health care provider. ? Avoid shrugging your shoulder while you raise your arm. Keep your shoulder blade tucked down toward the middle of your spine. 3. Hold for __________ seconds. 4. Slowly return to the starting position. Repeat __________ times. Complete this exercise __________ times a day. Exercise E: Flexion, active-assisted  1. Lie on your back. You may bend your knees for comfort. 2. Hold a broomstick, a cane, or a similar object with your hands about shoulder-width apart on the object. Your palms should face toward your feet. 3. Use both hands to raise the stick toward the ceiling. Continue by moving your arms in front of your face, then behind your head, toward the floor. Use your uninjured arm to help your left / right arm move farther. Stop when you feel a gentle stretch in your shoulder, or when you reach the angle where your health care provider tells you to stop. 4. Hold for __________ seconds. 5. Slowly return to the starting position. Repeat __________ times. Complete this exercise __________ times a day. Strengthening exercises These exercises build strength and endurance in your shoulder. Endurance is the ability to use your muscles for a long time, even after they get tired. Exercise F: Scapular depression and adduction 1. Sit on a stable chair. Support your arms in front of you with pillows, armrests, or a tabletop. Keep your elbows near the sides of your body. 2. Gently move your shoulder blades down and back toward your spine. Relax the muscles on the tops of your shoulders and in the back of your neck. 3. Hold for __________ seconds. 4. Slowly release the tension, and relax your muscles completely before you repeat the exercise. Repeat __________ times. Complete this exercise __________ times a day. After you have  practiced this exercise, try doing it without the arm support. Then, try doing it while standing instead of sitting. Exercise G: Scapular protraction, standing  1. Stand so you are facing a wall, about one arm-length away from the wall. 2. Place your hands on the wall and straighten your elbows. 3. Move your shoulder blades down, toward the middle of your spine. 4. Keep your shoulder blades down and move them forward, toward the wall. You should feel your shoulder blades sliding forward, around your ribcage. ? If you are not sure that you are doing this exercise correctly, ask your health care provider for more instructions. 5. Hold for __________ seconds. 6. Slowly return to the starting position. Let your muscles relax completely before you repeat this exercise. Repeat __________ times. Complete this exercise __________ times a day. Exercise H: Scapular protraction, supine  1. Lie on your back on a firm surface. Hold a __________ weight in your left / right hand. 2. Raise your left / right arm straight into the air so your hand is directly above your   shoulder joint. 3. Push the weight into the air so your shoulder blade lifts off of the surface that you are lying on. Do not move your head, neck, or back. 4. Hold for __________ seconds. 5. Slowly return to the starting position. Let your muscles relax completely before you repeat this exercise. Repeat __________ times. Complete this exercise __________ times a day. Exercise I: Scapular protraction, quadruped  1. Get on your hands and knees. Your hands should be directly below your shoulder blades. 2. Straighten your arms until your elbows are locked. 3. Round your back as much as you can. Think about lifting your rib cage up into your shoulder blades. Keep your neck muscles relaxed. 4. Hold for __________ seconds. 5. Slowly return to the starting position. Let your muscles relax completely before you repeat this exercise. Repeat __________  times. Complete this exercise __________ times a day. Exercise J: Scapular depression  1. Sit in a stable chair that has armrests. Sit upright, with your feet flat on the floor. 2. Put your hands on the armrests with your elbows bent and your fingers pointing forward. Your hands should be about even with the sides of your body. 3. Push down on the armrests to lift yourself off of the chair. Straighten your elbows and lift yourself up as much as you comfortably can. ? Move your shoulder blades down and back. ? Do not let your shoulders move up toward your ears. ? Keep your feet on the ground. As you get stronger, your feet should support less of your body weight as you do this exercise. 4. Hold for __________ seconds. 5. Slowly lower yourself back into the chair. Repeat __________ times. Complete this exercise __________ times a day. Exercise K: Shoulder extension, prone  1. Lie on your abdomen on a firm surface so your left / right arm hangs over the edge. 2. Hold a __________ weight in your left / right hand so your palm faces in toward your body. Your arm should be straight. 3. Squeeze your shoulder blade down toward the middle of your back. 4. Slowly raise your arm behind you and toward the ceiling, up to the height of the surface that you are lying on. Keep your arm straight. 5. Hold for __________ seconds. 6. Slowly return to the starting position and relax your muscles. Repeat __________ times. Complete this exercise __________ times a day. Exercise L: Horizontal abduction, prone 1. Lie on your abdomen on a firm surface so your left / right arm hangs over the edge. 2. Hold a __________ weight in your hand so your palm faces toward your feet. Your arm should be straight. 3. Squeeze your shoulder blade down toward the middle of your back. 4. Bend your elbow so your hand moves up, until your elbow is bent to an "L" shape (90 degree angle). With your elbow bent, slowly move your forearm  forward and up. Raise your hand up to the height of the surface that you are lying on. ? Your upper arm should not move, and your elbow should stay bent. ? At the top of the movement, your palm should face the floor. 5. Hold for __________ seconds. 6. Slowly return to the starting position and relax your muscles. Repeat __________ times. Complete this exercise __________ times a day. Exercise M: Scapular retraction  1. Sit in a stable chair without armrests, or stand. 2. Secure an exercise band to a stable object in front of you so it is at shoulder height.   3. Hold one end of the exercise band in each hand. Your palms should face down. 4. Straighten your elbows and lift your arms up to shoulder height. 5. Step back, away from the secured end of the exercise band, until the band stretches. 6. Squeeze your shoulder blades together and move your elbows back behind you. Do not shrug your shoulders while you do this. ? Your elbows should stay at about chest or shoulder height. ? Keep your upper arms lifted, away from your sides. 7. Hold for __________ seconds. 8. Slowly return to the starting position. Repeat __________ times. Complete this exercise __________ times a day. Exercise N: Shoulder extension  1. Sit in a stable chair without armrests, or stand. 2. Secure an exercise band to a stable object in front of you so it is at shoulder height. 3. Hold one end of the exercise band in each hand. Your palms should face each other. 4. Straighten your elbows and lift your hands up to shoulder height. 5. Step back, away from the secured end of the exercise band, until the band stretches. 6. Squeeze your shoulder blades together and pull your hands down to the sides of your thighs. Stop when your hands are straight down by your sides. Do not let your hands go behind your body. 7. Hold for __________ seconds. 8. Slowly return to the starting position. Repeat __________ times. Complete this exercise  __________ times a day. Exercise O: Scapular retraction and external rotation  1. Sit in a stable chair without armrests, or stand. 2. Secure an exercise band to a stable object in front of you so it is at shoulder height. 3. Hold one end of the exercise band in each hand. Your palms should face down. 4. Straighten your elbows and lift your hands up to shoulder height. 5. Step back, away from the secured end of the exercise band, until the band stretches. 6. Bend your elbows and raise your hands up to the height of your head. ? Your palms should face out, in front of you, at the top of the movement. ? Squeeze your shoulder blades together during this movement. 7. Hold for __________ seconds. 8. Slowly straighten your arms to return to the starting position. Repeat __________ times. Complete this exercise __________ times a day. This information is not intended to replace advice given to you by your health care provider. Make sure you discuss any questions you have with your health care provider. Document Released: 11/17/2005 Document Revised: 07/24/2016 Document Reviewed: 10/12/2015 Elsevier Interactive Patient Education  2018 [employer]  

## 2018-02-13 NOTE — Progress Notes (Signed)
Subjective:    Patient ID: Austin Watkins, male    DOB: 12/06/1945, 72 y.o.   MRN: [id number]  02/13/2018  Back Pain (X 1 week- upper left back pain)    HPI This 72 y.o. male presents with wife for evaluation of BACK PAIN for the past week. Last two nights have been horrible.  Unable to lay on either sides.   Keeping heat strips to area. Constant ache.  If lays down flat or sits in chair with high back where touches spot.   One specific area.  L of spine and below shoulder blade.   Full ROM of B arms.  Bending over worsens pain.   Taking one ibuprofen No shortness of breath. No rash. No radiation into arms or into legs.    BP Readings from Last 3 Encounters:  02/13/18 (!) 148/84  11/19/17 (!) 148/86  01/01/17 134/90   Wt Readings from Last 3 Encounters:  02/13/18 190 lb 6.4 oz (86.4 kg)  11/19/17 188 lb (85.3 kg)  01/01/17 189 lb (85.7 kg)   Immunization History  Administered Date(s) Administered  . Td 12/02/2007    Review of Systems  Constitutional: Negative for activity change, appetite change, chills, diaphoresis, fatigue and fever.  Respiratory: Negative for cough and shortness of breath.   Cardiovascular: Negative for chest pain, palpitations and leg swelling.  Gastrointestinal: Negative for abdominal pain, diarrhea, nausea and vomiting.  Endocrine: Negative for cold intolerance, heat intolerance, polydipsia, polyphagia and polyuria.  Musculoskeletal: Positive for back pain and myalgias. Negative for neck pain and neck stiffness.  Skin: Negative for color change, rash and wound.  Neurological: Negative for dizziness, tremors, seizures, syncope, facial asymmetry, speech difficulty, weakness, light-headedness, numbness and headaches.  Psychiatric/Behavioral: Negative for dysphoric mood and sleep disturbance. The patient is not nervous/anxious.     Past Medical History:  Diagnosis Date  . Allergy   . Cataract   . Hyperlipidemia    Past Surgical History:    Procedure Laterality Date  . knee scoped     Allergies  Allergen Reactions  . Neosporin [Neomycin-Bacitracin Zn-Polymyx] Other (See Comments)    Triple antibiotic - rash on skin   Current Outpatient Medications on File Prior to Visit  Medication Sig Dispense Refill  . aspirin 81 MG tablet Take 81 mg by mouth daily.    Marland Kitchen atorvastatin (LIPITOR) 10 MG tablet Take 1 tablet (10 mg total) by mouth daily. 90 tablet 3  . ketoconazole (NIZORAL) 2 % shampoo Apply 1 application topically 2 (two) times a week. 120 mL 2  . Multiple Vitamins-Minerals (MULTIVITAMIN ADULT) TABS Take by mouth.     No current facility-administered medications on file prior to visit.    Social History   Socioeconomic History  . Marital status: Married    Spouse name: Not on file  . Number of children: 2  . Years of education: Not on file  . Highest education level: Not on file  Occupational History  . Occupation: Retired  Scientific laboratory technician  . Financial resource strain: Not on file  . Food insecurity:    Worry: Not on file    Inability: Not on file  . Transportation needs:    Medical: Not on file    Non-medical: Not on file  Tobacco Use  . Smoking status: Never Smoker  . Smokeless tobacco: Never Used  Substance and Sexual Activity  . Alcohol use: No  . Drug use: No  . Sexual activity: Not on file  Lifestyle  .  Physical activity:    Days per week: Not on file    Minutes per session: Not on file  . Stress: Not on file  Relationships  . Social connections:    Talks on phone: Not on file    Gets together: Not on file    Attends religious service: Not on file    Active member of club or organization: Not on file    Attends meetings of clubs or organizations: Not on file    Relationship status: Not on file  . Intimate partner violence:    Fear of current or ex partner: Not on file    Emotionally abused: Not on file    Physically abused: Not on file    Forced sexual activity: Not on file  Other Topics  Concern  . Not on file  Social History Narrative   Married   Family History  Problem Relation Age of Onset  . Heart disease Mother   . Thyroid disease Mother   . Hyperlipidemia Mother   . Heart disease Father   . Stroke Father   . Diabetes Brother        Objective:    BP (!) 148/84 (BP Location: Right Arm, Patient Position: Sitting)   Pulse 71   Temp 97.7 F (36.5 C) (Oral)   Resp 16   Ht 5' 9.45" (1.764 m)   Wt 190 lb 6.4 oz (86.4 kg)   SpO2 95%   BMI 27.75 kg/m  Physical Exam  Constitutional: He is oriented to person, place, and time. He appears well-developed and well-nourished. No distress.  HENT:  Head: Normocephalic and atraumatic.  Right Ear: External ear normal.  Left Ear: External ear normal.  Nose: Nose normal.  Mouth/Throat: Oropharynx is clear and moist.  Eyes: Conjunctivae and EOM are normal. Pupils are equal, round, and reactive to light.  Neck: Normal range of motion. Neck supple. Carotid bruit is not present. No thyromegaly present.  Cardiovascular: Normal rate, regular rhythm, normal heart sounds and intact distal pulses. Exam reveals no gallop and no friction rub.  No murmur heard. Pulmonary/Chest: Effort normal and breath sounds normal. He has no wheezes. He has no rales.  Musculoskeletal:       Cervical back: He exhibits pain. He exhibits normal range of motion and no bony tenderness.       Thoracic back: He exhibits decreased range of motion, tenderness, pain and spasm. He exhibits no bony tenderness.       Lumbar back: Normal. He exhibits normal range of motion, no tenderness and no bony tenderness.  Pain of L thoracic region reproduced with flexion of neck.  Thoracic spine: no midline TTP; +TTP L paraspinal region; +pain reproduced with flexion and extension of thoracic spine; full ROM bilateral upper extremities without pain.   Lymphadenopathy:    He has no cervical adenopathy.  Neurological: He is alert and oriented to person, place, and  time. No cranial nerve deficit.  Skin: Skin is warm and dry. No rash noted. He is not diaphoretic.  Psychiatric: He has a normal mood and affect. His behavior is normal.  Nursing note and vitals reviewed.  No results found. Depression screen [facility] 2/9 02/13/2018 11/19/2017 12/26/2016 11/13/2016 11/05/2015  Decreased Interest 0 0 0 0 0  Down, Depressed, Hopeless 0 0 0 0 0  PHQ - 2 Score 0 0 0 0 0   Fall Risk  02/13/2018 11/19/2017 12/26/2016 11/13/2016 11/05/2015  Falls in the past year? No No No No No  Assessment & Plan:   1. Scapulocostal sprain, left, initial encounter   2. DDD (degenerative disc disease), cervical   3. DDD (degenerative disc disease), thoracic   4. Blood pressure elevated without history of HTN     New onset LEFT scapulocostal pain: New onset; thoracic spine and cervical spine films obtained in office.Cervical spine films reveal moderate to severe DDD at C3-4, C4-5, C5-6, C6-7 with osteophytes present at C4, C5, C6.  Thoracic spine films also reveal diffuse degenerative disc disease multilevel.  Treat with Flexeril and Tramadol; recommend heat versus ice; home exercise program recommended. If no improvement in one week, follow-up with Dr. Wiliam Ke.  No associated chest pain, shortness of breath, or nausea to suggest cardiac etiology; no associated nausea/vomiting/diarrhea/dyspepsia to suggest GI etiology.  Specific movements and positions reproduce pain to suggest musculoskeletal etiology.  Blood pressure elevation; New; likely secondary to acute pain; monitor closely at home; wife is retired Therapist, sports.  RTC if BP remains > 140/90.   Orders Placed This Encounter  Procedures  . DG Thoracic Spine 2 View    Standing Status:   Future    Number of Occurrences:   1    Standing Expiration Date:   02/13/2019    Order Specific Question:   Reason for Exam (SYMPTOM  OR DIAGNOSIS REQUIRED)    Answer:   L paraspinal and subscapular pain    Order Specific Question:   Preferred  imaging location?    Answer:   External  . DG Cervical Spine 2 or 3 views    Standing Status:   Future    Number of Occurrences:   1    Standing Expiration Date:   02/13/2019    Order Specific Question:   Reason for Exam (SYMPTOM  OR DIAGNOSIS REQUIRED)    Answer:   history of DDD cervical spine pain; scapular pain LEFT    Order Specific Question:   Preferred imaging location?    Answer:   External   Meds ordered this encounter  Medications  . DISCONTD: cyclobenzaprine (FLEXERIL) 5 MG tablet    Sig: Take 1 tablet (5 mg total) by mouth 3 (three) times daily as needed for muscle spasms.    Dispense:  30 tablet    Refill:  1  . DISCONTD: traMADol (ULTRAM) 50 MG tablet    Sig: Take 1 tablet (50 mg total) by mouth every 8 (eight) hours as needed.    Dispense:  30 tablet    Refill:  0  . traMADol (ULTRAM) 50 MG tablet    Sig: Take 1 tablet (50 mg total) by mouth every 8 (eight) hours as needed.    Dispense:  30 tablet    Refill:  0  . cyclobenzaprine (FLEXERIL) 5 MG tablet    Sig: Take 1 tablet (5 mg total) by mouth 3 (three) times daily as needed for muscle spasms.    Dispense:  30 tablet    Refill:  1    No follow-ups on file.   Londynn Sonoda Elayne Guerin, M.D. [facility] at [facility] [address] [place], [place]  [address] [phone] phone [phone] fax

## 2018-03-23 DIAGNOSIS — H2513 Age-related nuclear cataract, bilateral: Secondary | ICD-10-CM | POA: Insufficient documentation

## 2018-03-23 DIAGNOSIS — H43393 Other vitreous opacities, bilateral: Secondary | ICD-10-CM | POA: Insufficient documentation

## 2018-03-23 DIAGNOSIS — Z83511 Family history of glaucoma: Secondary | ICD-10-CM | POA: Insufficient documentation

## 2018-03-23 DIAGNOSIS — H02831 Dermatochalasis of right upper eyelid: Secondary | ICD-10-CM | POA: Insufficient documentation

## 2018-03-23 DIAGNOSIS — H25013 Cortical age-related cataract, bilateral: Secondary | ICD-10-CM | POA: Insufficient documentation

## 2018-03-23 DIAGNOSIS — H524 Presbyopia: Secondary | ICD-10-CM | POA: Insufficient documentation

## 2018-04-27 ENCOUNTER — Encounter: Payer: Self-pay | Admitting: Family Medicine

## 2018-07-01 ENCOUNTER — Telehealth: Payer: Self-pay | Admitting: Family Medicine

## 2018-07-01 NOTE — Telephone Encounter (Signed)
Please advise 

## 2018-07-01 NOTE — Telephone Encounter (Signed)
Copied from [place] [id number]. Topic: Quick Communication - See Telephone Encounter >> Jul 01, 2018 11:49 AM Rutherford Nail, NT wrote: CRM for notification. See Telephone encounter for: 07/01/18. Patient's wife calling and states that the patient has nasal congestion and drainage. States that he is managing well, but wanted to let Dr Carlota Raspberry know that he has been using Zyrtec for a week. Just realized that the bottle says that if you are 65 years or older to call and consult with your doctor. Patient's wife would like to know if it is okay for the patient to continue to take the Zyrtec. Please advise. CB#:[phone]

## 2018-07-01 NOTE — Telephone Encounter (Signed)
Ok to use zyrtec temporarily, just watch for side effects of dizziness/unsteadiness, sedation as potential side effects may be more common in patients over age 71.

## 2018-07-02 NOTE — Telephone Encounter (Signed)
°  Spoke with pt wife and gave her the information per Dr Nyoka Cowden . She said he has had a bout with dizzyness and has not taken the Zyrtec in a ciouple of days  And would like to know what he can take

## 2018-07-05 ENCOUNTER — Telehealth: Payer: Self-pay

## 2018-07-05 NOTE — Telephone Encounter (Signed)
Call from wife, Mardene Celeste, who is on ROI.  Upset that she was advised to schedule OV as she only needs recommendation for medicine.  States pt has allergies and the drainage has worsened lately.  Did have reaction of dizziness with Zyrtec.  Has d/c Zyrtec and dizziness has resolved.  Taking OTC antihistamine but it doesn't help much.  Spoke with Dr. Carlota Raspberry and c/b to wife.  Per provider, hold off on antihistamine and take nasal spray, such as Flonase, which is less likely to cause systemic SEs.  May take a couple days to take effect.  If pt has other concerning s/s, develops fever, or feels it is more than allergies, he should be seen.  Wife expresses understanding.

## 2018-07-05 NOTE — Telephone Encounter (Signed)
I have spoke to patient's wife Austin Watkins and she stated that she was frustrated and has not had a call back. I have informed her that someone had spoken to her on 07/02/2018. I went on to say that Austin Watkins will need an appointment for he has not been seen since 12/20/2018b with Dr. Nyoka Cowden and 02/13/2018 with Dr. Tamala Julian, however Austin Watkins's wife did not want to hear that for she hung up the phone.  FYI to provider

## 2018-07-05 NOTE — Telephone Encounter (Signed)
Instructions relayed to clinical manager to give to patient.  Can try over-the-counter steroid nasal spray for allergy symptoms, avoid antihistamines at this time, RTC precautions if not helping.

## 2018-07-23 ENCOUNTER — Telehealth: Payer: Self-pay | Admitting: Family Medicine

## 2018-07-23 NOTE — Telephone Encounter (Signed)
Left a VM in regards to his appt he has with Dr. Carlota Raspberry on 11/25/2018. The provider will be out of the office on that day and needs to be rescheduled.

## 2018-08-31 ENCOUNTER — Telehealth: Payer: Self-pay | Admitting: Family Medicine

## 2018-08-31 NOTE — Telephone Encounter (Signed)
Called pt. To reschedule visit with Dr. Carlota Raspberry on 11/30/18. Left VM to call back. If pt. Calls back please reschedule

## 2018-11-25 ENCOUNTER — Encounter: Payer: Federal, State, Local not specified - PPO | Admitting: Family Medicine

## 2018-11-30 ENCOUNTER — Encounter: Payer: Federal, State, Local not specified - PPO | Admitting: Family Medicine

## 2018-12-02 ENCOUNTER — Ambulatory Visit (INDEPENDENT_AMBULATORY_CARE_PROVIDER_SITE_OTHER): Payer: Federal, State, Local not specified - PPO | Admitting: Family Medicine

## 2018-12-02 ENCOUNTER — Encounter: Payer: Self-pay | Admitting: Family Medicine

## 2018-12-02 VITALS — BP 138/80 | HR 60 | Temp 98.7°F | Ht 70.0 in | Wt 192.0 lb

## 2018-12-02 DIAGNOSIS — Z125 Encounter for screening for malignant neoplasm of prostate: Secondary | ICD-10-CM | POA: Diagnosis not present

## 2018-12-02 DIAGNOSIS — E785 Hyperlipidemia, unspecified: Secondary | ICD-10-CM | POA: Diagnosis not present

## 2018-12-02 DIAGNOSIS — Z0000 Encounter for general adult medical examination without abnormal findings: Secondary | ICD-10-CM

## 2018-12-02 DIAGNOSIS — Z0001 Encounter for general adult medical examination with abnormal findings: Secondary | ICD-10-CM

## 2018-12-02 NOTE — Progress Notes (Signed)
Subjective:    Patient ID: Austin Watkins, male    DOB: 1946-01-09, 73 y.o.   MRN: [id number]  HPI Austin Watkins is a 73 y.o. male Presents today for: Chief Complaint  Patient presents with  . Annual Exam    CPE   Hyperlipidemia:  Lab Results  Component Value Date   CHOL 161 11/19/2017   HDL 55 11/19/2017   LDLCALC 86 11/19/2017   TRIG 98 11/19/2017   CHOLHDL 2.9 11/19/2017   Lab Results  Component Value Date   ALT 33 11/13/2016   AST 27 11/13/2016   ALKPHOS 93 11/13/2016   BILITOT 0.9 11/13/2016  on lipitor 59m QD. No new side effects.   Cancer screen: Colonoscopy 12/09/2017, no further testing recommended.  PSA 2.4 last year, down from 2.6 prior.  Elects for testing. Derm appt once per year.   Immunization History  Administered Date(s) Administered  . Td 12/02/2007   Flu vaccine, pneumonia vaccination and tetanus vaccination are all declined.   No falls in the past year  [facility] 12/02/2018 02/13/2018 11/19/2017 12/26/2016 11/13/2016  Decreased Interest 0 0 0 0 0  Down, Depressed, Hopeless 0 0 0 0 0  PHQ - 2 Score 0 0 0 0 0   Functional Status Survey: Is the patient deaf or have difficulty hearing?: No Does the patient have difficulty seeing, even when wearing glasses/contacts?: No Does the patient have difficulty concentrating, remembering, or making decisions?: No Does the patient have difficulty walking or climbing stairs?: No Does the patient have difficulty dressing or bathing?: No Does the patient have difficulty doing errands alone such as visiting a doctor's office or shopping?: No  Memory screen: 6CIT Screen 12/02/2018 11/19/2017  What Year? 0 points 0 points  What month? 0 points 0 points  What time? 0 points 0 points  Count back from 20 0 points 0 points  Months in reverse 0 points 0 points  Repeat phrase 2 points 0 points  Total Score 2 0    Visual Acuity Screening   Right eye Left eye Both eyes  Without correction:       With correction: _0  last optho visit last year - appt in April.   Dental: followed routinely - Q632mo Seen in past month   Exercise: walking until weather cold. Less exercise recently.  No gym membership. Unsure of silver sneakers.   Advanced directives: Has healthcare power of attorney and living will.   Patient Active Problem List   Diagnosis Date Noted  . Cellulitis of foot, right 04/25/2014  . Closed fracture of fifth metatarsal bone of right foot 04/18/2014  . Closed fracture of the right second, third, and fifth proximal phalanges of the foot 04/18/2014  . Lipid disorder 03/29/2012  . Allergy history unknown 03/08/2012  . Bronchospasm 03/08/2012   Past Medical History:  Diagnosis Date  . Allergy   . Cataract   . Hyperlipidemia    Past Surgical History:  Procedure Laterality Date  . knee scoped     Allergies  Allergen Reactions  . Neosporin [Neomycin-Bacitracin Zn-Polymyx] Other (See Comments)    Triple antibiotic - rash on skin   Prior to Admission medications   Medication Sig Start Date End Date Taking? Authorizing Provider  aspirin 81 MG tablet Take 81 mg by mouth daily.   Yes [provider]  atorvastatin (LIPITOR) 10 MG tablet Take 1 tablet (10 mg total) by mouth daily. 11/19/17  Yes GrMerri Ray  R, MD  Multiple Vitamins-Minerals (MULTIVITAMIN ADULT) TABS Take by mouth.   Yes [provider]   Social History   Socioeconomic History  . Marital status: Married    Spouse name: Not on file  . Number of children: 2  . Years of education: Not on file  . Highest education level: Not on file  Occupational History  . Occupation: Retired  Scientific laboratory technician  . Financial resource strain: Not on file  . Food insecurity:    Worry: Not on file    Inability: Not on file  . Transportation needs:    Medical: Not on file    Non-medical: Not on file  Tobacco Use  . Smoking status: Never Smoker  . Smokeless tobacco: Never Used   Substance and Sexual Activity  . Alcohol use: No  . Drug use: No  . Sexual activity: Not on file  Lifestyle  . Physical activity:    Days per week: Not on file    Minutes per session: Not on file  . Stress: Not on file  Relationships  . Social connections:    Talks on phone: Not on file    Gets together: Not on file    Attends religious service: Not on file    Active member of club or organization: Not on file    Attends meetings of clubs or organizations: Not on file    Relationship status: Not on file  . Intimate partner violence:    Fear of current or ex partner: Not on file    Emotionally abused: Not on file    Physically abused: Not on file    Forced sexual activity: Not on file  Other Topics Concern  . Not on file  Social History Narrative   Married   Review of Systems 13 point review of systems per patient health survey noted.  Negative other than as indicated above or in HPI.      Objective:   Physical Exam Vitals signs reviewed.  Constitutional:      Appearance: He is well-developed.  HENT:     Head: Normocephalic and atraumatic.     Right Ear: External ear normal.     Left Ear: External ear normal.  Eyes:     Conjunctiva/sclera: Conjunctivae normal.     Pupils: Pupils are equal, round, and reactive to light.  Neck:     Musculoskeletal: Normal range of motion and neck supple.     Thyroid: No thyromegaly.  Cardiovascular:     Rate and Rhythm: Normal rate and regular rhythm.     Heart sounds: Normal heart sounds.  Pulmonary:     Effort: Pulmonary effort is normal. No respiratory distress.     Breath sounds: Normal breath sounds. No wheezing.  Abdominal:     General: There is no distension.     Palpations: Abdomen is soft.     Tenderness: There is no abdominal tenderness.     Hernia: There is no hernia in the right inguinal area or left inguinal area.  Genitourinary:    Prostate: Normal.  Musculoskeletal: Normal range of motion.        General: No  tenderness.  Lymphadenopathy:     Cervical: No cervical adenopathy.  Skin:    General: Skin is warm and dry.  Neurological:     Mental Status: He is alert and oriented to person, place, and time.     Deep Tendon Reflexes: Reflexes are normal and symmetric.  Psychiatric:  Behavior: Behavior normal.    Vitals:   12/02/18 0956 12/02/18 1004  BP: (!) 158/80 138/80  Pulse: 60   Temp: 98.7 F (37.1 C)   SpO2: 99%        Assessment & Plan:   Austin Watkins is a 73 y.o. male Hyperlipidemia, unspecified hyperlipidemia type - Plan: Lipid panel, Comprehensive metabolic panel  -  Stable, tolerating current regimen. Medications refilled. Labs pending as above.   Medicare annual wellness visit, subsequent  - - anticipatory guidance as below in AVS, screening labs if needed. Health maintenance items as above in HPI discussed/recommended as applicable.  - no concerning responses on depression, fall, or functional status screening. Any positive responses noted as above. Advanced directives discussed as in CHL. Recommended vaccines above, but declined and denied any discussion or questions re: immunizations.   Screening for prostate cancer - Plan: PSA  - We discussed pros and cons of prostate cancer screening, and after this discussion, he chose to have screening done. PSA obtained, and no concerning findings on DRE.    No orders of the defined types were placed in this encounter.  Patient Instructions    No changes in meds for now. I will check some labs today. Some form of exercise like walking most days per week.   Thanks for coming in today.    Preventive Care 73 Years and Older, Male Preventive care refers to lifestyle choices and visits with your health care provider that can promote health and wellness. What does preventive care include?   A yearly physical exam. This is also called an annual well check.  Dental exams once or twice a year.  Routine eye exams. Ask your  health care provider how often you should have your eyes checked.  Personal lifestyle choices, including: ? Daily care of your teeth and gums. ? Regular physical activity. ? Eating a healthy diet. ? Avoiding tobacco and drug use. ? Limiting alcohol use. ? Practicing safe sex. ? Taking low doses of aspirin every day. ? Taking vitamin and mineral supplements as recommended by your health care provider. What happens during an annual well check? The services and screenings done by your health care provider during your annual well check will depend on your age, overall health, lifestyle risk factors, and family history of disease. Counseling Your health care provider may ask you questions about your:  Alcohol use.  Tobacco use.  Drug use.  Emotional well-being.  Home and relationship well-being.  Sexual activity.  Eating habits.  History of falls.  Memory and ability to understand (cognition).  Work and work Statistician. Screening You may have the following tests or measurements:  Height, weight, and BMI.  Blood pressure.  Lipid and cholesterol levels. These may be checked every 5 years, or more frequently if you are over 26 years old.  Skin check.  Lung cancer screening. You may have this screening every year starting at age 14 if you have a 30-pack-year history of smoking and currently smoke or have quit within the past 15 years.  Colorectal cancer screening. All adults should have this screening starting at age 65 and continuing until age 6. You will have tests every 1-10 years, depending on your results and the type of screening test. People at increased risk should start screening at an earlier age. Screening tests may include: ? Guaiac-based fecal occult blood testing. ? Fecal immunochemical test (FIT). ? Stool DNA test. ? Virtual colonoscopy. ? Sigmoidoscopy. During this test, a flexible  tube with a tiny camera (sigmoidoscope) is used to examine your rectum and  lower colon. The sigmoidoscope is inserted through your anus into your rectum and lower colon. ? Colonoscopy. During this test, a long, thin, flexible tube with a tiny camera (colonoscope) is used to examine your entire colon and rectum.  Prostate cancer screening. Recommendations will vary depending on your family history and other risks.  Hepatitis C blood test.  Hepatitis B blood test.  Sexually transmitted disease (STD) testing.  Diabetes screening. This is done by checking your blood sugar (glucose) after you have not eaten for a while (fasting). You may have this done every 1-3 years.  Abdominal aortic aneurysm (AAA) screening. You may need this if you are a current or former smoker.  Osteoporosis. You may be screened starting at age 34 if you are at high risk. Talk with your health care provider about your test results, treatment options, and if necessary, the need for more tests. Vaccines Your health care provider may recommend certain vaccines, such as:  Influenza vaccine. This is recommended every year.  Tetanus, diphtheria, and acellular pertussis (Tdap, Td) vaccine. You may need a Td booster every 10 years.  Varicella vaccine. You may need this if you have not been vaccinated.  Zoster vaccine. You may need this after age 65.  Measles, mumps, and rubella (MMR) vaccine. You may need at least one dose of MMR if you were born in 1957 or later. You may also need a second dose.  Pneumococcal 13-valent conjugate (PCV13) vaccine. One dose is recommended after age 35.  Pneumococcal polysaccharide (PPSV23) vaccine. One dose is recommended after age 52.  Meningococcal vaccine. You may need this if you have certain conditions.  Hepatitis A vaccine. You may need this if you have certain conditions or if you travel or work in places where you may be exposed to hepatitis A.  Hepatitis B vaccine. You may need this if you have certain conditions or if you travel or work in places  where you may be exposed to hepatitis B.  Haemophilus influenzae type b (Hib) vaccine. You may need this if you have certain risk factors. Talk to your health care provider about which screenings and vaccines you need and how often you need them. This information is not intended to replace advice given to you by your health care provider. Make sure you discuss any questions you have with your health care provider. Document Released: 12/14/2015 Document Revised: 01/07/2018 Document Reviewed: 09/18/2015 Elsevier Interactive Patient Education  [employer].   If you have lab work done today you will be contacted with your lab results within the next 2 weeks.  If you have not heard from [place] then please contact us. The fastest way to get your results is to register for My Chart.   IF you received an x-ray today, you will receive an invoice from [facility]. Please contact [facility] at [phone] with questions or concerns regarding your invoice.   IF you received labwork today, you will receive an invoice from [place]. Please contact [facility] at [phone] with questions or concerns regarding your invoice.   Our billing staff will not be able to assist you with questions regarding bills from these companies.  You will be contacted with the lab results as soon as they are available. The fastest way to get your results is to activate your My Chart account. Instructions are located on the last page of this paperwork. If you have not  heard from [place] regarding the results in 2 weeks, please contact this office.       Signed,   Merri Ray, MD [facility].  12/02/18 10:41 AM

## 2018-12-02 NOTE — Patient Instructions (Addendum)
No changes in meds for now. I will check some labs today. Some form of exercise like walking most days per week.   Thanks for coming in today.    Preventive Care 9 Years and Older, Male Preventive care refers to lifestyle choices and visits with your health care provider that can promote health and wellness. What does preventive care include?   A yearly physical exam. This is also called an annual well check.  Dental exams once or twice a year.  Routine eye exams. Ask your health care provider how often you should have your eyes checked.  Personal lifestyle choices, including: ? Daily care of your teeth and gums. ? Regular physical activity. ? Eating a healthy diet. ? Avoiding tobacco and drug use. ? Limiting alcohol use. ? Practicing safe sex. ? Taking low doses of aspirin every day. ? Taking vitamin and mineral supplements as recommended by your health care provider. What happens during an annual well check? The services and screenings done by your health care provider during your annual well check will depend on your age, overall health, lifestyle risk factors, and family history of disease. Counseling Your health care provider may ask you questions about your:  Alcohol use.  Tobacco use.  Drug use.  Emotional well-being.  Home and relationship well-being.  Sexual activity.  Eating habits.  History of falls.  Memory and ability to understand (cognition).  Work and work Statistician. Screening You may have the following tests or measurements:  Height, weight, and BMI.  Blood pressure.  Lipid and cholesterol levels. These may be checked every 5 years, or more frequently if you are over 61 years old.  Skin check.  Lung cancer screening. You may have this screening every year starting at age 59 if you have a 30-pack-year history of smoking and currently smoke or have quit within the past 15 years.  Colorectal cancer screening. All adults should have this  screening starting at age 50 and continuing until age 26. You will have tests every 1-10 years, depending on your results and the type of screening test. People at increased risk should start screening at an earlier age. Screening tests may include: ? Guaiac-based fecal occult blood testing. ? Fecal immunochemical test (FIT). ? Stool DNA test. ? Virtual colonoscopy. ? Sigmoidoscopy. During this test, a flexible tube with a tiny camera (sigmoidoscope) is used to examine your rectum and lower colon. The sigmoidoscope is inserted through your anus into your rectum and lower colon. ? Colonoscopy. During this test, a long, thin, flexible tube with a tiny camera (colonoscope) is used to examine your entire colon and rectum.  Prostate cancer screening. Recommendations will vary depending on your family history and other risks.  Hepatitis C blood test.  Hepatitis B blood test.  Sexually transmitted disease (STD) testing.  Diabetes screening. This is done by checking your blood sugar (glucose) after you have not eaten for a while (fasting). You may have this done every 1-3 years.  Abdominal aortic aneurysm (AAA) screening. You may need this if you are a current or former smoker.  Osteoporosis. You may be screened starting at age 72 if you are at high risk. Talk with your health care provider about your test results, treatment options, and if necessary, the need for more tests. Vaccines Your health care provider may recommend certain vaccines, such as:  Influenza vaccine. This is recommended every year.  Tetanus, diphtheria, and acellular pertussis (Tdap, Td) vaccine. You may need a Td booster every  10 years.  Varicella vaccine. You may need this if you have not been vaccinated.  Zoster vaccine. You may need this after age 50.  Measles, mumps, and rubella (MMR) vaccine. You may need at least one dose of MMR if you were born in 1957 or later. You may also need a second dose.  Pneumococcal  13-valent conjugate (PCV13) vaccine. One dose is recommended after age 40.  Pneumococcal polysaccharide (PPSV23) vaccine. One dose is recommended after age 70.  Meningococcal vaccine. You may need this if you have certain conditions.  Hepatitis A vaccine. You may need this if you have certain conditions or if you travel or work in places where you may be exposed to hepatitis A.  Hepatitis B vaccine. You may need this if you have certain conditions or if you travel or work in places where you may be exposed to hepatitis B.  Haemophilus influenzae type b (Hib) vaccine. You may need this if you have certain risk factors. Talk to your health care provider about which screenings and vaccines you need and how often you need them. This information is not intended to replace advice given to you by your health care provider. Make sure you discuss any questions you have with your health care provider. Document Released: 12/14/2015 Document Revised: 01/07/2018 Document Reviewed: 09/18/2015 Elsevier Interactive Patient Education  [employer].   If you have lab work done today you will be contacted with your lab results within the next 2 weeks.  If you have not heard from [place] then please contact us. The fastest way to get your results is to register for My Chart.   IF you received an x-ray today, you will receive an invoice from [facility]. Please contact [facility]iology at [phone] with questions or concerns regarding your invoice.   IF you received labwork today, you will receive an invoice from [place]. Please contact [facility] at [phone] with questions or concerns regarding your invoice.   Our billing staff will not be able to assist you with questions regarding bills from these companies.  You will be contacted with the lab results as soon as they are available. The fastest way to get your results is to activate your My Chart account. Instructions are located on the  last page of this paperwork. If you have not heard from [place] regarding the results in 2 weeks, please contact this office.

## 2018-12-03 LAB — COMPREHENSIVE METABOLIC PANEL
ALT: 23 IU/L (ref 0–44)
AST: 18 IU/L (ref 0–40)
Albumin/Globulin Ratio: 1.8 (ref 1.2–2.2)
Albumin: 4.6 g/dL (ref 3.5–4.8)
Alkaline Phosphatase: 77 IU/L (ref 39–117)
BUN/Creatinine Ratio: 16 (ref 10–24)
BUN: 16 mg/dL (ref 8–27)
Bilirubin Total: 0.6 mg/dL (ref 0.0–1.2)
CALCIUM: 9.7 mg/dL (ref 8.6–10.2)
CO2: 19 mmol/L — AB (ref 20–29)
Chloride: 104 mmol/L (ref 96–106)
Creatinine, Ser: 0.98 mg/dL (ref 0.76–1.27)
GFR, EST AFRICAN AMERICAN: 89 mL/min/{1.73_m2} (ref >59)
GFR, EST NON AFRICAN AMERICAN: 77 mL/min/{1.73_m2} (ref >59)
GLOBULIN, TOTAL: 2.5 g/dL (ref 1.5–4.5)
Glucose: 107 mg/dL — ABNORMAL HIGH (ref 65–99)
Potassium: 4.1 mmol/L (ref 3.5–5.2)
Sodium: 143 mmol/L (ref 134–144)
TOTAL PROTEIN: 7.1 g/dL (ref 6.0–8.5)

## 2018-12-03 LAB — LIPID PANEL
CHOLESTEROL TOTAL: 172 mg/dL (ref 100–199)
Chol/HDL Ratio: 2.9 ratio (ref 0.0–5.0)
HDL: 60 mg/dL (ref >39)
LDL Calculated: 98 mg/dL (ref 0–99)
TRIGLYCERIDES: 69 mg/dL (ref 0–149)
VLDL Cholesterol Cal: 14 mg/dL (ref 5–40)

## 2018-12-03 LAB — PSA: PROSTATE SPECIFIC AG, SERUM: 2.3 ng/mL (ref 0.0–4.0)

## 2018-12-15 ENCOUNTER — Other Ambulatory Visit: Payer: Self-pay | Admitting: Family Medicine

## 2018-12-15 DIAGNOSIS — E785 Hyperlipidemia, unspecified: Secondary | ICD-10-CM

## 2019-02-11 ENCOUNTER — Telehealth: Payer: Self-pay | Admitting: Family Medicine

## 2019-02-11 NOTE — Telephone Encounter (Signed)
Copied from [place] [id number]. Topic: General - Other >> Feb 11, 2019  2:49 PM Carolyn Stare wrote:   Ask for a call back concerning pt coming into the office in April base on guide lines she saw on the TV concerning pt over 60

## 2019-02-14 NOTE — Telephone Encounter (Signed)
Called pt and advised that there was no appointments scheduled for him in April and if he has anymore questions to give [place] a call here at the office.

## 2019-04-21 IMAGING — DX DG CERVICAL SPINE 2 OR 3 VIEWS
3 series · 3 of 3 positions shown · non-contrast
Comparison: None.

CLINICAL DATA: Cervicalgia

EXAM:
CERVICAL SPINE - 2-3 VIEW

[c-spine lat]
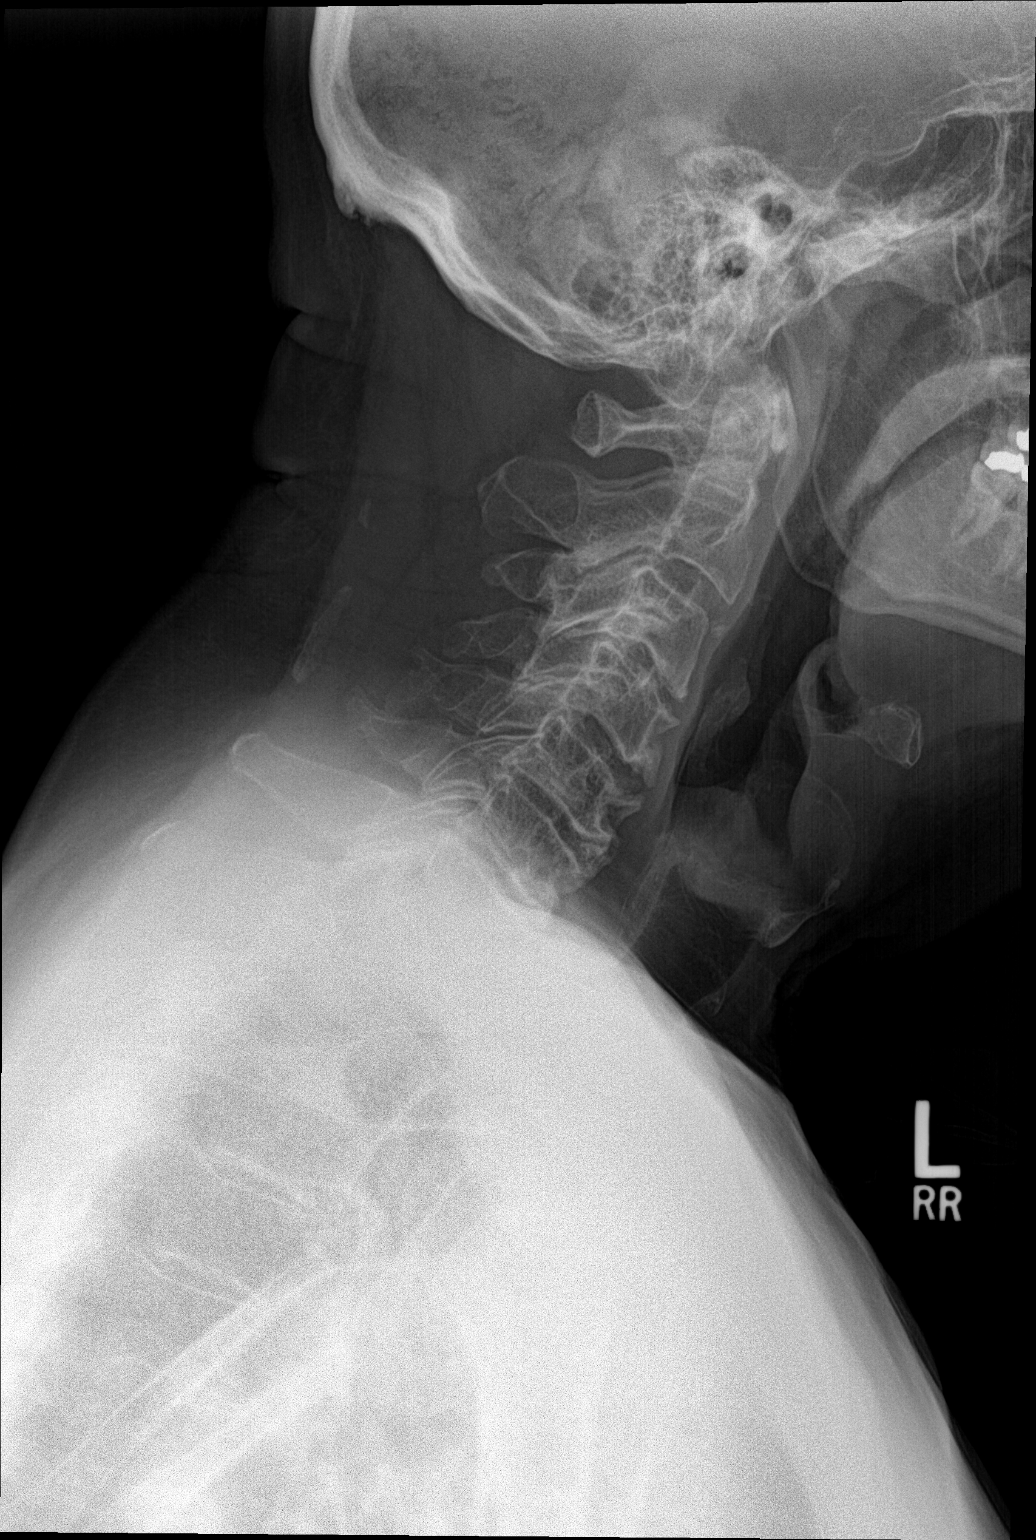

[c-spine ap]
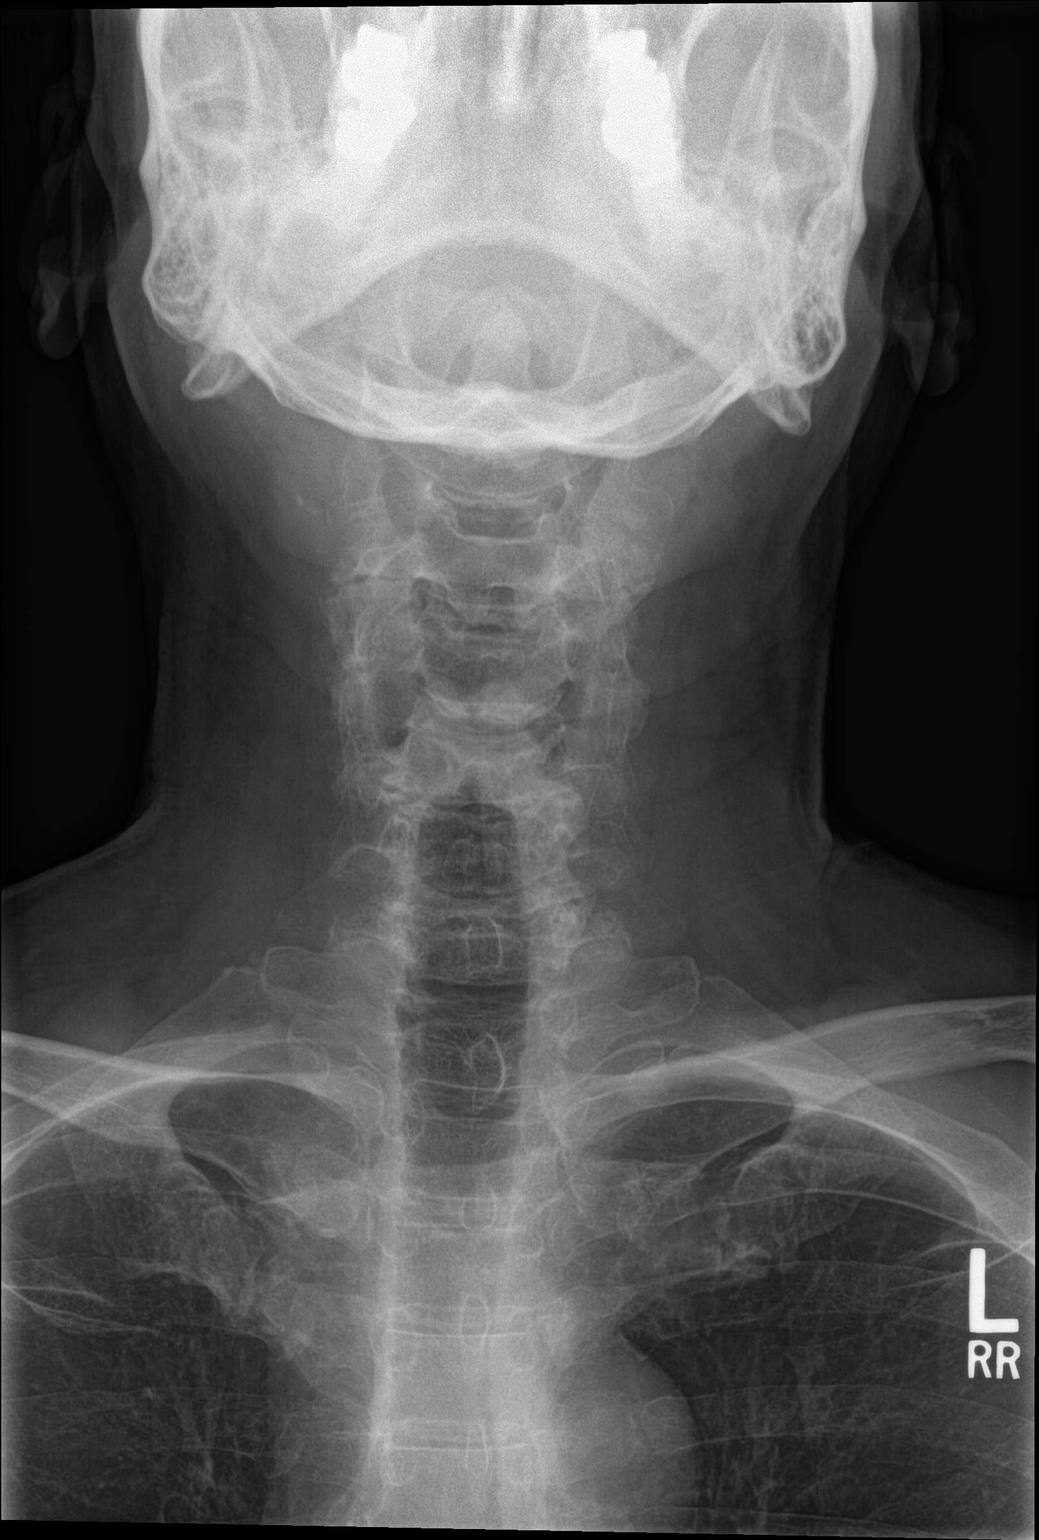

[c-spine open mouth]
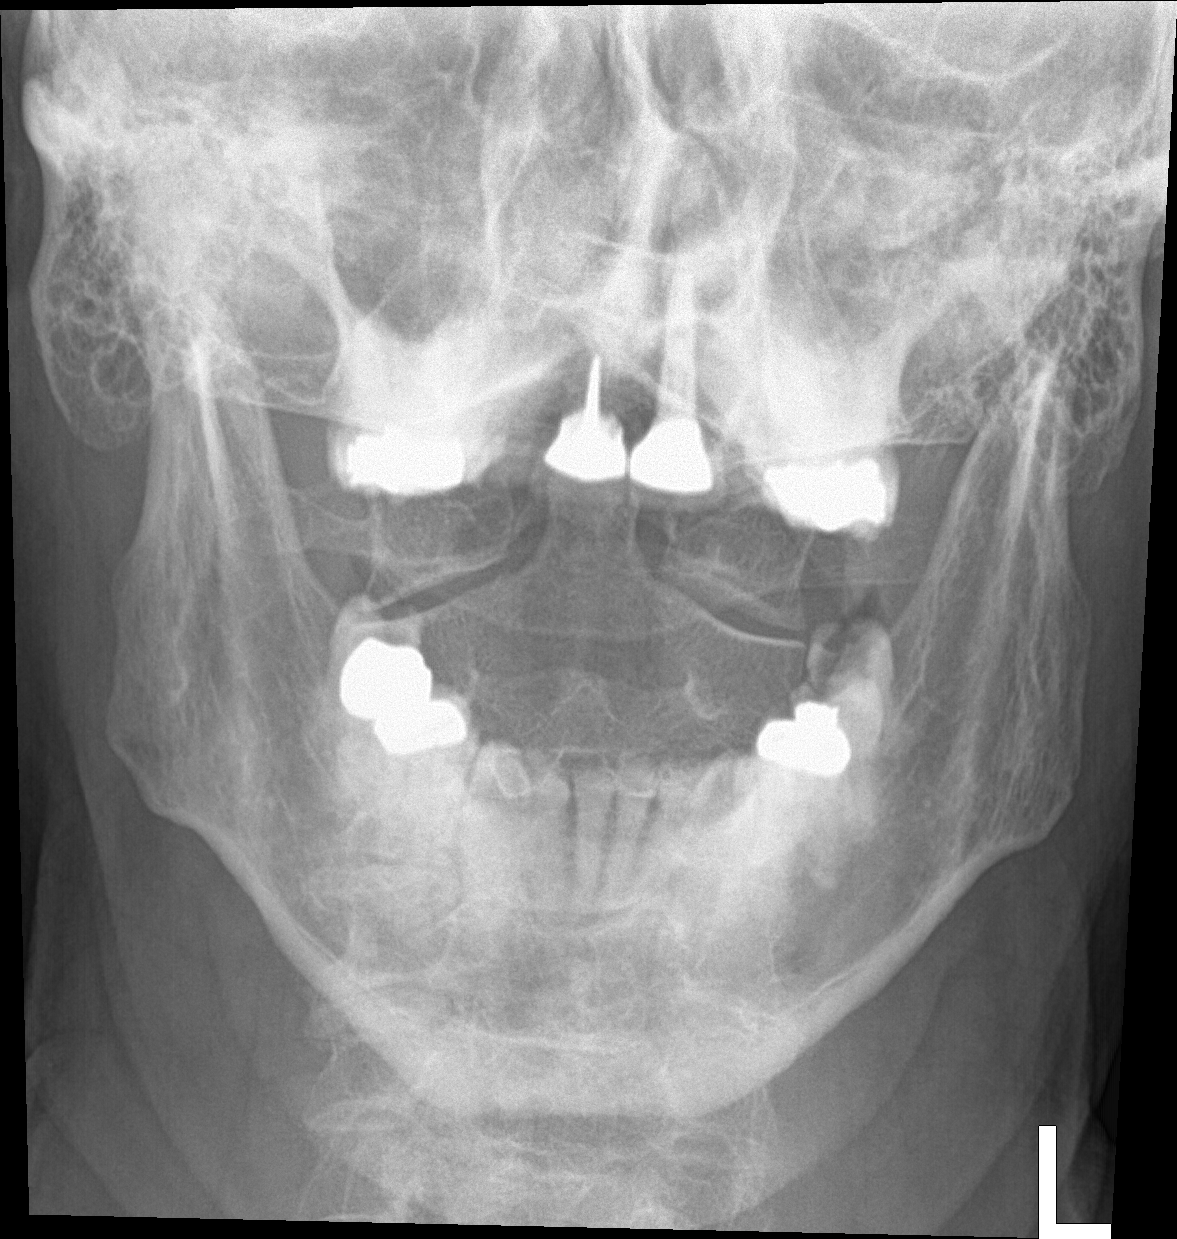

[3 of 3 positions shown; findings below may reference images not displayed]

FINDINGS: Frontal, lateral, and open-mouth odontoid images were obtained.
There is no fracture or spondylolisthesis. Prevertebral soft tissues
and predental space regions are normal. There is moderately severe
disc space narrowing at C3-4, C4-5, C5-6, and C6-7. There are
prominent anterior osteophytes at C4, C5, and C6. No erosive change.
There are scattered foci of carotid artery calcification
bilaterally. Lung apices are clear.
IMPRESSION: Osteoarthritic change at several levels. No fracture or
spondylolisthesis. Scattered foci of carotid artery calcification
noted.

## 2019-04-21 IMAGING — DX DG THORACIC SPINE 2V
2 series · 2 of 2 positions shown · non-contrast
Comparison: Chest radiograph December 22, 2011

CLINICAL DATA: Dorsalgia

EXAM:
THORACIC SPINE 2 VIEWS

[t-spine ap]
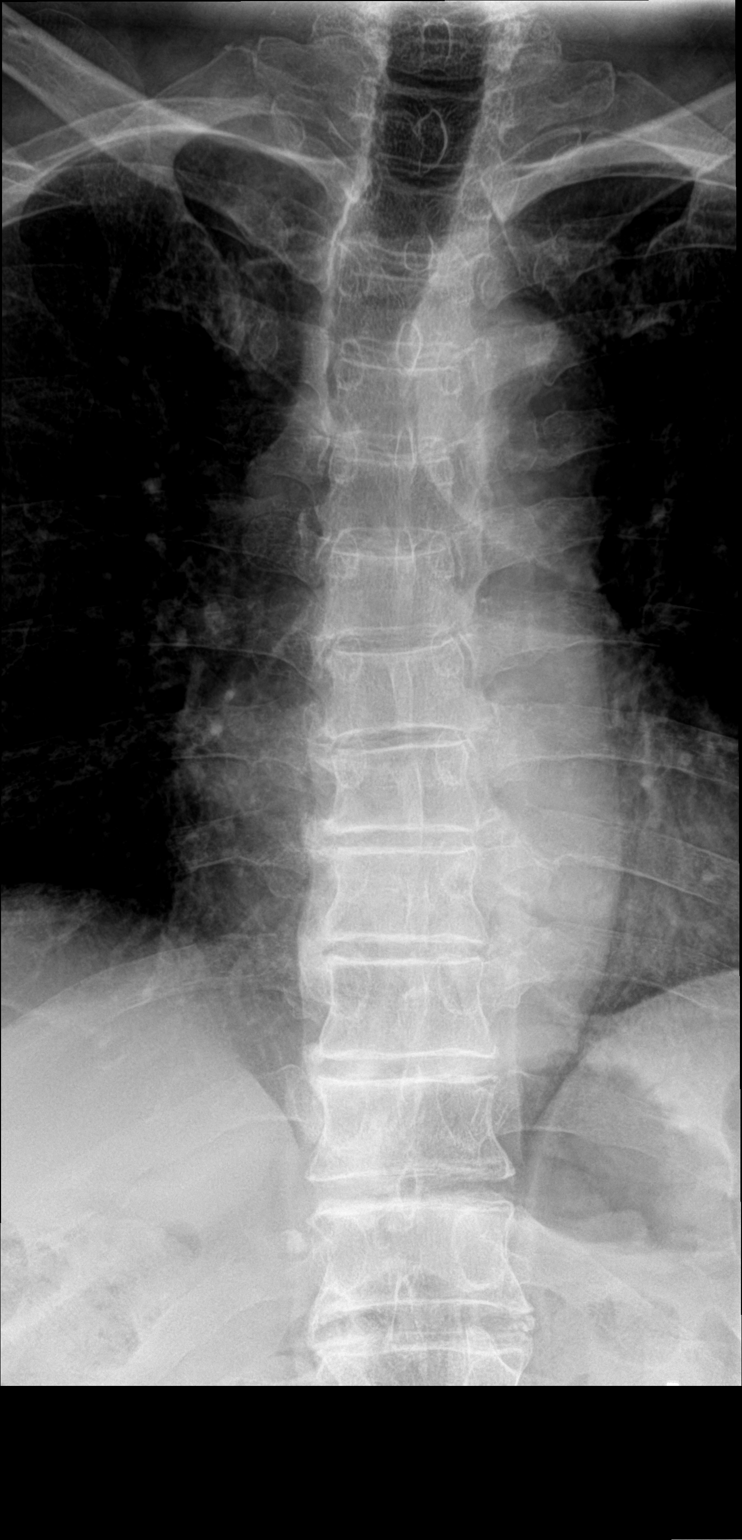

[t-spine lat]
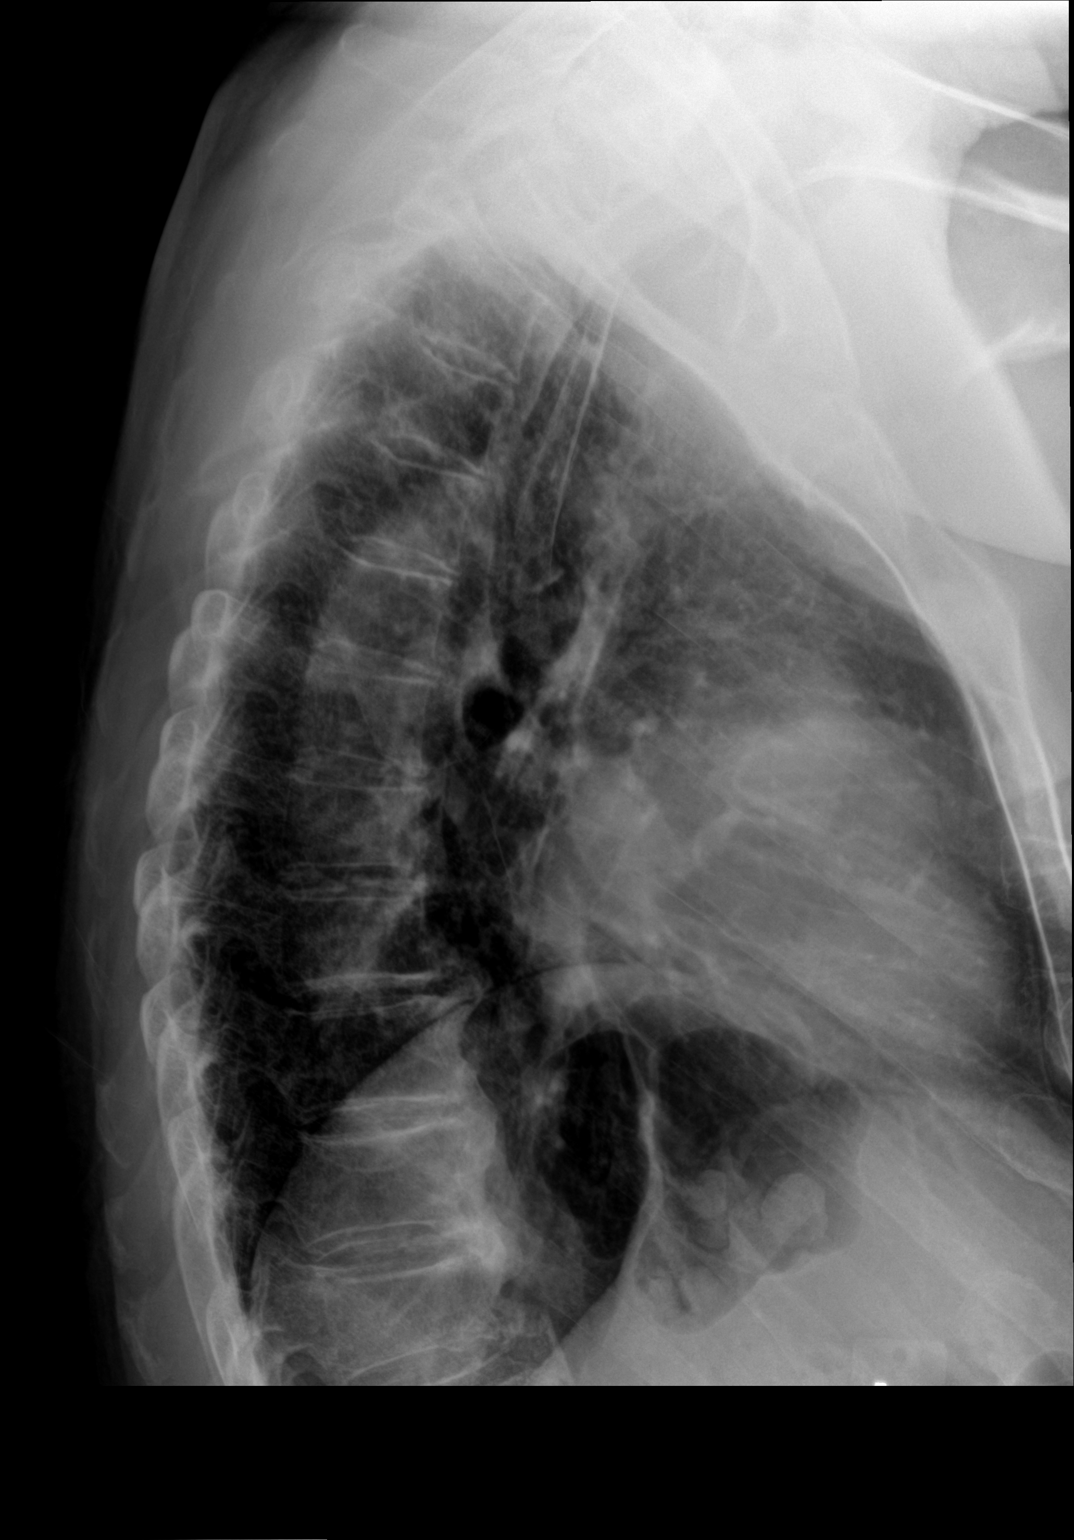

[2 of 2 positions shown; findings below may reference images not displayed]

FINDINGS: Frontal and lateral views obtained. There is no fracture or
spondylolisthesis. There is disc space narrowing at several levels
in the mid to lower thoracic region. There are multiple anterior and
right lateral osteophytes. No erosive change or paraspinous lesion.
IMPRESSION: Osteoarthritic change at several levels. No fracture or
spondylolisthesis.

## 2019-09-22 ENCOUNTER — Other Ambulatory Visit: Payer: Self-pay | Admitting: Osteopathic Medicine

## 2019-09-22 DIAGNOSIS — E785 Hyperlipidemia, unspecified: Secondary | ICD-10-CM

## 2020-01-14 ENCOUNTER — Ambulatory Visit: Payer: Self-pay

## 2020-01-15 ENCOUNTER — Ambulatory Visit: Payer: Federal, State, Local not specified - PPO | Attending: Internal Medicine

## 2020-01-15 DIAGNOSIS — Z23 Encounter for immunization: Secondary | ICD-10-CM

## 2020-01-15 NOTE — Progress Notes (Signed)
   Covid-19 Vaccination Clinic  Name:  Austin Watkins    MRN: [id number] DOB: 10/25/1946  01/15/2020  [name] was observed post Covid-19 immunization for 15 minutes without incidence. He was provided with Vaccine Information Sheet and instruction to access the V-Safe system.   [name] was instructed to call 911 with any severe reactions post vaccine: Marland Kitchen Difficulty breathing  . Swelling of your face and throat  . A fast heartbeat  . A bad rash all over your body  . Dizziness and weakness    Immunizations Administered    Name Date Dose VIS Date Route   Pfizer COVID-19 Vaccine 01/15/2020 11:45 AM 0.3 mL 11/11/2019 Intramuscular   Manufacturer: [place]   Lot: [id number]   [place]: [id number]

## 2020-02-07 ENCOUNTER — Ambulatory Visit: Payer: Federal, State, Local not specified - PPO | Attending: Internal Medicine

## 2020-02-07 DIAGNOSIS — Z23 Encounter for immunization: Secondary | ICD-10-CM | POA: Insufficient documentation

## 2020-02-07 NOTE — Progress Notes (Signed)
   Covid-19 Vaccination Clinic  Name:  Austin Watkins    MRN: [id number] DOB: 1946-06-02  02/07/2020  [name] was observed post Covid-19 immunization for 15 minutes without incident. He was provided with Vaccine Information Sheet and instruction to access the V-Safe system.   [name] was instructed to call 911 with any severe reactions post vaccine: Marland Kitchen Difficulty breathing  . Swelling of face and throat  . A fast heartbeat  . A bad rash all over body  . Dizziness and weakness   Immunizations Administered    Name Date Dose VIS Date Route   Pfizer COVID-19 Vaccine 02/07/2020  3:01 PM 0.3 mL 11/11/2019 Intramuscular   Manufacturer: [place]   Lot: [id number]   [place]: [id number]

## 2020-06-13 ENCOUNTER — Other Ambulatory Visit: Payer: Self-pay | Admitting: Family Medicine

## 2020-06-13 DIAGNOSIS — E785 Hyperlipidemia, unspecified: Secondary | ICD-10-CM

## 2020-07-18 ENCOUNTER — Encounter: Payer: Self-pay | Admitting: Family Medicine

## 2020-07-18 ENCOUNTER — Ambulatory Visit (INDEPENDENT_AMBULATORY_CARE_PROVIDER_SITE_OTHER): Payer: Federal, State, Local not specified - PPO | Admitting: Family Medicine

## 2020-07-18 ENCOUNTER — Other Ambulatory Visit: Payer: Self-pay

## 2020-07-18 VITALS — BP 136/88 | HR 71 | Temp 98.3°F | Ht 70.0 in | Wt 187.0 lb

## 2020-07-18 DIAGNOSIS — E785 Hyperlipidemia, unspecified: Secondary | ICD-10-CM

## 2020-07-18 DIAGNOSIS — Z0000 Encounter for general adult medical examination without abnormal findings: Secondary | ICD-10-CM

## 2020-07-18 DIAGNOSIS — Z131 Encounter for screening for diabetes mellitus: Secondary | ICD-10-CM

## 2020-07-18 DIAGNOSIS — Z0001 Encounter for general adult medical examination with abnormal findings: Secondary | ICD-10-CM

## 2020-07-18 DIAGNOSIS — R739 Hyperglycemia, unspecified: Secondary | ICD-10-CM | POA: Diagnosis not present

## 2020-07-18 DIAGNOSIS — Z125 Encounter for screening for malignant neoplasm of prostate: Secondary | ICD-10-CM

## 2020-07-18 MED ORDER — ATORVASTATIN CALCIUM 10 MG PO TABS: ORAL_TABLET | ORAL | 3 refills | Status: DC

## 2020-07-18 NOTE — Patient Instructions (Addendum)
I will check some labs today, no med changes. Recheck in 6 months, and can look at blood sugar again. Thanks for coming in today.    Preventive Care 16 Years and Older, Male Preventive care refers to lifestyle choices and visits with your health care provider that can promote health and wellness. This includes:  A yearly physical exam. This is also called an annual well check.  Regular dental and eye exams.  Immunizations.  Screening for certain conditions.  Healthy lifestyle choices, such as diet and exercise. What can I expect for my preventive care visit? Physical exam Your health care provider will check:  Height and weight. These may be used to calculate body mass index (BMI), which is a measurement that tells if you are at a healthy weight.  Heart rate and blood pressure.  Your skin for abnormal spots. Counseling Your health care provider may ask you questions about:  Alcohol, tobacco, and drug use.  Emotional well-being.  Home and relationship well-being.  Sexual activity.  Eating habits.  History of falls.  Memory and ability to understand (cognition).  Work and work Statistician. What immunizations do I need?  Influenza (flu) vaccine  This is recommended every year. Tetanus, diphtheria, and pertussis (Tdap) vaccine  You may need a Td booster every 10 years. Varicella (chickenpox) vaccine  You may need this vaccine if you have not already been vaccinated. Zoster (shingles) vaccine  You may need this after age 47. Pneumococcal conjugate (PCV13) vaccine  One dose is recommended after age 2. Pneumococcal polysaccharide (PPSV23) vaccine  One dose is recommended after age 70. Measles, mumps, and rubella (MMR) vaccine  You may need at least one dose of MMR if you were born in 1957 or later. You may also need a second dose. Meningococcal conjugate (MenACWY) vaccine  You may need this if you have certain conditions. Hepatitis A vaccine  You may  need this if you have certain conditions or if you travel or work in places where you may be exposed to hepatitis A. Hepatitis B vaccine  You may need this if you have certain conditions or if you travel or work in places where you may be exposed to hepatitis B. Haemophilus influenzae type b (Hib) vaccine  You may need this if you have certain conditions. You may receive vaccines as individual doses or as more than one vaccine together in one shot (combination vaccines). Talk with your health care provider about the risks and benefits of combination vaccines. What tests do I need? Blood tests  Lipid and cholesterol levels. These may be checked every 5 years, or more frequently depending on your overall health.  Hepatitis C test.  Hepatitis B test. Screening  Lung cancer screening. You may have this screening every year starting at age 56 if you have a 30-pack-year history of smoking and currently smoke or have quit within the past 15 years.  Colorectal cancer screening. All adults should have this screening starting at age 50 and continuing until age 96. Your health care provider may recommend screening at age 41 if you are at increased risk. You will have tests every 1-10 years, depending on your results and the type of screening test.  Prostate cancer screening. Recommendations will vary depending on your family history and other risks.  Diabetes screening. This is done by checking your blood sugar (glucose) after you have not eaten for a while (fasting). You may have this done every 1-3 years.  Abdominal aortic aneurysm (AAA) screening.  You may need this if you are a current or former smoker.  Sexually transmitted disease (STD) testing. Follow these instructions at home: Eating and drinking  Eat a diet that includes fresh fruits and vegetables, whole grains, lean protein, and low-fat dairy products. Limit your intake of foods with high amounts of sugar, saturated fats, and  salt.  Take vitamin and mineral supplements as recommended by your health care provider.  Do not drink alcohol if your health care provider tells you not to drink.  If you drink alcohol: ? Limit how much you have to 0-2 drinks a day. ? Be aware of how much alcohol is in your drink. In the [place] one drink equals one 12 oz bottle of beer (355 mL), one 5 oz glass of wine (148 mL), or one 1 oz glass of hard liquor (44 mL). Lifestyle  Take daily care of your teeth and gums.  Stay active. Exercise for at least 30 minutes on 5 or more days each week.  Do not use any products that contain nicotine or tobacco, such as cigarettes, e-cigarettes, and chewing tobacco. If you need help quitting, ask your health care provider.  If you are sexually active, practice safe sex. Use a condom or other form of protection to prevent STIs (sexually transmitted infections).  Talk with your health care provider about taking a low-dose aspirin or statin. What's next?  Visit your health care provider once a year for a well check visit.  Ask your health care provider how often you should have your eyes and teeth checked.  Stay up to date on all vaccines. This information is not intended to replace advice given to you by your health care provider. Make sure you discuss any questions you have with your health care provider. Document Revised: 11/11/2018 Document Reviewed: 11/11/2018 Elsevier Patient Education  [employer].   If you have lab work done today you will be contacted with your lab results within the next 2 weeks.  If you have not heard from [place] then please contact us. The fastest way to get your results is to register for My Chart.   IF you received an x-ray today, you will receive an invoice from [facility]. Please contact [facility] at [phone] with questions or concerns regarding your invoice.   IF you received labwork today, you will receive an invoice from [facility].  Please contact [facility] at [phone] with questions or concerns regarding your invoice.   Our billing staff will not be able to assist you with questions regarding bills from these companies.  You will be contacted with the lab results as soon as they are available. The fastest way to get your results is to activate your My Chart account. Instructions are located on the last page of this paperwork. If you have not heard from [place] regarding the results in 2 weeks, please contact this office.

## 2020-07-18 NOTE — Progress Notes (Signed)
Subjective:  Patient ID: Austin Watkins, male    DOB: [birth year]  Age: 74 y.o. MRN: [id number]  CC:  Chief Complaint  Patient presents with  . Medicare Wellness    Pt reports he feels good with no complaints. Pt is fasting. Pt states he is has fainted before during blood draw will inform lab to have pt lay down for blood work.    HPI Austin Watkins presents for   Annual wellness exam. Recent visit with Grandkids in [place].   Care team: PCP, me Ophthalmology, Dr. Trinna Post, 07/03/2020, vitreous floaters, hyperopia, dermatochalasis of upper eyelids, cataract of both eyes. No plan for repair at this point.   Hyperglycemia: Glucose 107 in January. Some increased urination with more water and coffee only. Only occasional single nocturia.   Hyperlipidemia: Lipitor 10 mg daily, aspirin 81 mg daily.  No new side effects.  Lab Results  Component Value Date   CHOL 172 12/02/2018   HDL 60 12/02/2018   LDLCALC 98 12/02/2018   TRIG 69 12/02/2018   CHOLHDL 2.9 12/02/2018   Lab Results  Component Value Date   ALT 23 12/02/2018   AST 18 12/02/2018   ALKPHOS 77 12/02/2018   BILITOT 0.6 12/02/2018   Fall Risk  07/18/2020 12/02/2018 02/13/2018 11/19/2017 12/26/2016  Falls in the past year? 0 0 No No No  Follow up Falls evaluation completed - - - -  single level, no stairs.  Adequate lighting.  Has grab bars in bathroom.   Depression screen [facility] 2/9 07/18/2020 12/02/2018 02/13/2018 11/19/2017 12/26/2016  Decreased Interest 0 0 0 0 0  Down, Depressed, Hopeless 0 0 0 0 0  PHQ - 2 Score 0 0 0 0 0   Cancer screening Colonoscopy 12/09/2017 PSA has been stable past 4 years. After r/b of prostate testing discussed, he declined testing today.  Lab Results  Component Value Date   PSA1 2.3 12/02/2018   PSA1 2.4 11/19/2017   PSA1 2.6 11/13/2016   PSA 2.12 11/05/2015   PSA 1.89 09/04/2014   PSA 1.48 03/28/2013   Immunization History  Administered Date(s) Administered  . PFIZER SARS-COV-2  Vaccination 01/15/2020, 02/07/2020  . Td 12/02/2007  Pneumonia vaccination recommended, declined.   Functional Status Survey: Is the patient deaf or have difficulty hearing?: No Does the patient have difficulty seeing, even when wearing glasses/contacts?: No Does the patient have difficulty concentrating, remembering, or making decisions?: No Does the patient have difficulty walking or climbing stairs?: No Does the patient have difficulty dressing or bathing?: No Does the patient have difficulty doing errands alone such as visiting a doctor's office or shopping?: No  6CIT Screen 07/18/2020 12/02/2018 11/19/2017  What Year? 0 points 0 points 0 points  What month? 0 points 0 points 0 points  What time? 0 points 0 points 0 points  Count back from 20 0 points 0 points 0 points  Months in reverse 0 points 0 points 0 points  Repeat phrase 0 points 2 points 0 points  Total Score 0 2 0      Office Visit from 07/18/2020 in [facility]  AUDIT-C Score 0      Hearing Screening   '125Hz'$  $Remo'250Hz'gRols$'500Hz'$'1000Hz'$'2000Hz'$'3000Hz'$'4000Hz'$'6000Hz'$'8000Hz'$   Right ear:           Left ear:             Visual Acuity Screening   Right eye Left eye Both eyes  Without correction:  With correction: $RemoveBeforeDE'20/25 20/25 20/25 'lEdaDPXzQZNeset$   Dental: Every 6 months. Recently rescheduled.   Exercise: prior frequent walking, limited with increased heat and rain lately.  Wt Readings from Last 3 Encounters:  07/18/20 187 lb (84.8 kg)  12/02/18 192 lb (87.1 kg)  02/13/18 190 lb 6.4 oz (86.4 kg)     Advanced directives: He has a healthcare power of attorney and living will  History Patient Active Problem List   Diagnosis Date Noted  . Cellulitis of foot, right 04/25/2014  . Closed fracture of fifth metatarsal bone of right foot 04/18/2014  . Closed fracture of the right second, third, and fifth proximal phalanges of the foot 04/18/2014  . Lipid disorder 03/29/2012  . Allergy history unknown 03/08/2012  . Bronchospasm  03/08/2012   Past Medical History:  Diagnosis Date  . Allergy   . Cataract   . Hyperlipidemia    Past Surgical History:  Procedure Laterality Date  . knee scoped     Allergies  Allergen Reactions  . Neosporin [Neomycin-Bacitracin Zn-Polymyx] Other (See Comments)    Triple antibiotic - rash on skin   Prior to Admission medications   Medication Sig Start Date End Date Taking? Authorizing Provider  aspirin 81 MG tablet Take 81 mg by mouth daily.   Yes [provider]  atorvastatin (LIPITOR) 10 MG tablet TAKE 1 TABLET(10 MG) BY MOUTH DAILY 06/13/20  Yes Wendie Agreste, MD  Multiple Vitamins-Minerals (MULTIVITAMIN ADULT) TABS Take by mouth.   Yes [provider]   Social History   Socioeconomic History  . Marital status: Married    Spouse name: Not on file  . Number of children: 2  . Years of education: Not on file  . Highest education level: Not on file  Occupational History  . Occupation: Retired  Tobacco Use  . Smoking status: Never Smoker  . Smokeless tobacco: Never Used  Vaping Use  . Vaping Use: Never used  Substance and Sexual Activity  . Alcohol use: No  . Drug use: No  . Sexual activity: Yes  Other Topics Concern  . Not on file  Social History Narrative   Married   Social Determinants of Health   Financial Resource Strain:   . Difficulty of Paying Living Expenses:   Food Insecurity:   . Worried About Charity fundraiser in the Last Year:   . Arboriculturist in the Last Year:   Transportation Needs:   . Film/video editor (Medical):   Marland Kitchen Lack of Transportation (Non-Medical):   Physical Activity:   . Days of Exercise per Week:   . Minutes of Exercise per Session:   Stress:   . Feeling of Stress :   Social Connections:   . Frequency of Communication with Friends and Family:   . Frequency of Social Gatherings with Friends and Family:   . Attends Religious Services:   . Active Member of Clubs or Organizations:   . Attends English as a second language teacher Meetings:   Marland Kitchen Marital Status:   Intimate Partner Violence:   . Fear of Current or Ex-Partner:   . Emotionally Abused:   Marland Kitchen Physically Abused:   . Sexually Abused:     Review of Systems 13 point review of systems per patient health survey noted.  Negative other than as indicated above or in HPI.    Objective:   Vitals:   07/18/20 1044 07/18/20 1058  BP: (!) 154/82 136/88  Pulse: 71   Temp: 98.3 F (36.8  C)   TempSrc: Temporal   SpO2: 97%   Weight: 187 lb (84.8 kg)   Height: $Remove'5\' 10"'GgyEcXX$  (1.778 m)      Physical Exam Vitals reviewed.  Constitutional:      Appearance: He is well-developed.  HENT:     Head: Normocephalic and atraumatic.     Right Ear: External ear normal.     Left Ear: External ear normal.  Eyes:     Conjunctiva/sclera: Conjunctivae normal.     Pupils: Pupils are equal, round, and reactive to light.  Neck:     Thyroid: No thyromegaly.  Cardiovascular:     Rate and Rhythm: Normal rate and regular rhythm.     Heart sounds: Normal heart sounds.  Pulmonary:     Effort: Pulmonary effort is normal. No respiratory distress.     Breath sounds: Normal breath sounds. No wheezing.  Abdominal:     General: There is no distension.     Palpations: Abdomen is soft.     Tenderness: There is no abdominal tenderness.  Musculoskeletal:        General: No tenderness. Normal range of motion.     Cervical back: Normal range of motion and neck supple.  Lymphadenopathy:     Cervical: No cervical adenopathy.  Skin:    General: Skin is warm and dry.  Neurological:     Mental Status: He is alert and oriented to person, place, and time.     Deep Tendon Reflexes: Reflexes are normal and symmetric.  Psychiatric:        Behavior: Behavior normal.        Assessment & Plan:  Austin Watkins is a 74 y.o. male . Medicare annual wellness visit, subsequent  - - anticipatory guidance as below in AVS, screening labs if needed. Health maintenance items as above in  HPI discussed/recommended as applicable.  - no concerning responses on depression, fall, or functional status screening. Any positive responses noted as above. Advanced directives discussed as in CHL.   Hyperlipidemia, unspecified hyperlipidemia type - Plan: Lipid panel, Comprehensive metabolic panel, atorvastatin (LIPITOR) 10 MG tablet  -  Stable, tolerating current regimen. Medications refilled. Labs pending as above.   Screening for diabetes mellitus (DM) - Plan: Hemoglobin A1c Hyperglycemia - Plan: Hemoglobin A1c  - check A1c.   No orders of the defined types were placed in this encounter.  Patient Instructions    I will check some labs today, no med changes. Recheck in 6 months, and can look at blood sugar again. Thanks for coming in today.    Preventive Care 10 Years and Older, Male Preventive care refers to lifestyle choices and visits with your health care provider that can promote health and wellness. This includes:  A yearly physical exam. This is also called an annual well check.  Regular dental and eye exams.  Immunizations.  Screening for certain conditions.  Healthy lifestyle choices, such as diet and exercise. What can I expect for my preventive care visit? Physical exam Your health care provider will check:  Height and weight. These may be used to calculate body mass index (BMI), which is a measurement that tells if you are at a healthy weight.  Heart rate and blood pressure.  Your skin for abnormal spots. Counseling Your health care provider may ask you questions about:  Alcohol, tobacco, and drug use.  Emotional well-being.  Home and relationship well-being.  Sexual activity.  Eating habits.  History of falls.  Memory and ability to  understand (cognition).  Work and work Statistician. What immunizations do I need?  Influenza (flu) vaccine  This is recommended every year. Tetanus, diphtheria, and pertussis (Tdap) vaccine  You may need a  Td booster every 10 years. Varicella (chickenpox) vaccine  You may need this vaccine if you have not already been vaccinated. Zoster (shingles) vaccine  You may need this after age 60. Pneumococcal conjugate (PCV13) vaccine  One dose is recommended after age 25. Pneumococcal polysaccharide (PPSV23) vaccine  One dose is recommended after age 73. Measles, mumps, and rubella (MMR) vaccine  You may need at least one dose of MMR if you were born in 1957 or later. You may also need a second dose. Meningococcal conjugate (MenACWY) vaccine  You may need this if you have certain conditions. Hepatitis A vaccine  You may need this if you have certain conditions or if you travel or work in places where you may be exposed to hepatitis A. Hepatitis B vaccine  You may need this if you have certain conditions or if you travel or work in places where you may be exposed to hepatitis B. Haemophilus influenzae type b (Hib) vaccine  You may need this if you have certain conditions. You may receive vaccines as individual doses or as more than one vaccine together in one shot (combination vaccines). Talk with your health care provider about the risks and benefits of combination vaccines. What tests do I need? Blood tests  Lipid and cholesterol levels. These may be checked every 5 years, or more frequently depending on your overall health.  Hepatitis C test.  Hepatitis B test. Screening  Lung cancer screening. You may have this screening every year starting at age 65 if you have a 30-pack-year history of smoking and currently smoke or have quit within the past 15 years.  Colorectal cancer screening. All adults should have this screening starting at age 15 and continuing until age 51. Your health care provider may recommend screening at age 70 if you are at increased risk. You will have tests every 1-10 years, depending on your results and the type of screening test.  Prostate cancer screening.  Recommendations will vary depending on your family history and other risks.  Diabetes screening. This is done by checking your blood sugar (glucose) after you have not eaten for a while (fasting). You may have this done every 1-3 years.  Abdominal aortic aneurysm (AAA) screening. You may need this if you are a current or former smoker.  Sexually transmitted disease (STD) testing. Follow these instructions at home: Eating and drinking  Eat a diet that includes fresh fruits and vegetables, whole grains, lean protein, and low-fat dairy products. Limit your intake of foods with high amounts of sugar, saturated fats, and salt.  Take vitamin and mineral supplements as recommended by your health care provider.  Do not drink alcohol if your health care provider tells you not to drink.  If you drink alcohol: ? Limit how much you have to 0-2 drinks a day. ? Be aware of how much alcohol is in your drink. In the [place] one drink equals one 12 oz bottle of beer (355 mL), one 5 oz glass of wine (148 mL), or one 1 oz glass of hard liquor (44 mL). Lifestyle  Take daily care of your teeth and gums.  Stay active. Exercise for at least 30 minutes on 5 or more days each week.  Do not use any products that contain nicotine or tobacco, such as cigarettes,  e-cigarettes, and chewing tobacco. If you need help quitting, ask your health care provider.  If you are sexually active, practice safe sex. Use a condom or other form of protection to prevent STIs (sexually transmitted infections).  Talk with your health care provider about taking a low-dose aspirin or statin. What's next?  Visit your health care provider once a year for a well check visit.  Ask your health care provider how often you should have your eyes and teeth checked.  Stay up to date on all vaccines. This information is not intended to replace advice given to you by your health care provider. Make sure you discuss any questions you have with  your health care provider. Document Revised: 11/11/2018 Document Reviewed: 11/11/2018 Elsevier Patient Education  [employer].   If you have lab work done today you will be contacted with your lab results within the next 2 weeks.  If you have not heard from [place] then please contact us. The fastest way to get your results is to register for My Chart.   IF you received an x-ray today, you will receive an invoice from [facility]. Please contact [facility] at [phone] with questions or concerns regarding your invoice.   IF you received labwork today, you will receive an invoice from [place]. Please contact [facility] at [phone] with questions or concerns regarding your invoice.   Our billing staff will not be able to assist you with questions regarding bills from these companies.  You will be contacted with the lab results as soon as they are available. The fastest way to get your results is to activate your My Chart account. Instructions are located on the last page of this paperwork. If you have not heard from [place] regarding the results in 2 weeks, please contact this office.         Signed, Merri Ray, MD [facility]

## 2020-07-19 ENCOUNTER — Encounter: Payer: Self-pay | Admitting: Family Medicine

## 2020-07-19 LAB — LIPID PANEL
Chol/HDL Ratio: 2.8 ratio (ref 0.0–5.0)
Cholesterol, Total: 178 mg/dL (ref 100–199)
HDL: 63 mg/dL (ref >39)
LDL Chol Calc (NIH): 99 mg/dL (ref 0–99)
Triglycerides: 87 mg/dL (ref 0–149)
VLDL Cholesterol Cal: 16 mg/dL (ref 5–40)

## 2020-07-19 LAB — COMPREHENSIVE METABOLIC PANEL
ALT: 30 IU/L (ref 0–44)
AST: 27 IU/L (ref 0–40)
Albumin/Globulin Ratio: 1.8 (ref 1.2–2.2)
Albumin: 4.8 g/dL — ABNORMAL HIGH (ref 3.7–4.7)
Alkaline Phosphatase: 105 IU/L (ref 48–121)
BUN/Creatinine Ratio: 24 (ref 10–24)
BUN: 23 mg/dL (ref 8–27)
Bilirubin Total: 0.8 mg/dL (ref 0.0–1.2)
CO2: 25 mmol/L (ref 20–29)
Calcium: 9.7 mg/dL (ref 8.6–10.2)
Chloride: 101 mmol/L (ref 96–106)
Creatinine, Ser: 0.96 mg/dL (ref 0.76–1.27)
GFR calc Af Amer: 90 mL/min/{1.73_m2} (ref >59)
GFR calc non Af Amer: 78 mL/min/{1.73_m2} (ref >59)
Globulin, Total: 2.6 g/dL (ref 1.5–4.5)
Glucose: 98 mg/dL (ref 65–99)
Potassium: 5 mmol/L (ref 3.5–5.2)
Sodium: 139 mmol/L (ref 134–144)
Total Protein: 7.4 g/dL (ref 6.0–8.5)

## 2020-07-19 LAB — HEMOGLOBIN A1C
Est. average glucose Bld gHb Est-mCnc: 117 mg/dL
Hgb A1c MFr Bld: 5.7 % — ABNORMAL HIGH (ref 4.8–5.6)

## 2020-07-19 LAB — PSA: Prostate Specific Ag, Serum: 3.2 ng/mL (ref 0.0–4.0)

## 2020-09-15 ENCOUNTER — Ambulatory Visit: Payer: Federal, State, Local not specified - PPO | Attending: Internal Medicine

## 2020-09-15 DIAGNOSIS — Z23 Encounter for immunization: Secondary | ICD-10-CM

## 2020-09-15 NOTE — Progress Notes (Signed)
   Covid-19 Vaccination Clinic  Name:  [name]    MRN: [id number] DOB: February 10, 1946  09/15/2020  Austin Watkins was observed post Covid-19 immunization for 15 minutes without incident. He was provided with Vaccine Information Sheet and instruction to access the V-Safe system.   Mr. Luber was instructed to call 911 with any severe reactions post vaccine: Marland Kitchen Difficulty breathing  . Swelling of face and throat  . A fast heartbeat  . A bad rash all over body  . Dizziness and weakness

## 2021-01-16 ENCOUNTER — Other Ambulatory Visit: Payer: Self-pay

## 2021-01-16 ENCOUNTER — Ambulatory Visit (INDEPENDENT_AMBULATORY_CARE_PROVIDER_SITE_OTHER): Payer: Federal, State, Local not specified - PPO | Admitting: Family Medicine

## 2021-01-16 ENCOUNTER — Encounter: Payer: Self-pay | Admitting: Family Medicine

## 2021-01-16 VITALS — BP 144/82 | HR 73 | Temp 97.4°F | Ht 70.0 in | Wt 191.0 lb

## 2021-01-16 DIAGNOSIS — E785 Hyperlipidemia, unspecified: Secondary | ICD-10-CM

## 2021-01-16 DIAGNOSIS — R7303 Prediabetes: Secondary | ICD-10-CM | POA: Diagnosis not present

## 2021-01-16 MED ORDER — ATORVASTATIN CALCIUM 10 MG PO TABS: ORAL_TABLET | ORAL | 3 refills | Status: DC

## 2021-01-16 NOTE — Patient Instructions (Addendum)
No change in medication at this time.  I will check the prediabetes test again but it was just borderline at last visit.  Follow-up in 6 months but let me know if there are questions sooner.  Keep up the good work with walking and watching diet.   If you have lab work done today you will be contacted with your lab results within the next 2 weeks.  If you have not heard from [place] then please contact us. The fastest way to get your results is to register for My Chart.   IF you received an x-ray today, you will receive an invoice from [facility]. Please contact [facility] at [phone] with questions or concerns regarding your invoice.   IF you received labwork today, you will receive an invoice from [place]. Please contact [facility] at [phone] with questions or concerns regarding your invoice.   Our billing staff will not be able to assist you with questions regarding bills from these companies.  You will be contacted with the lab results as soon as they are available. The fastest way to get your results is to activate your My Chart account. Instructions are located on the last page of this paperwork. If you have not heard from [place] regarding the results in 2 weeks, please contact this office.

## 2021-01-16 NOTE — Progress Notes (Signed)
Subjective:  Patient ID: Austin Watkins, male    DOB: [birth year]  Age: 75 y.o. MRN: [id number]  CC:  Chief Complaint  Patient presents with  . Follow-up    On hyperlipidemia and prediabetes. Pt reports no physical symptoms that he has noticed.PT reports his current medication seems to work well with no known side effects.    HPI Austin Watkins presents for   Hyperlipidemia: Lipitor 10 mg daily.  Has also remain on aspirin 81 mg daily.  Risk/benefits of aspirin use discussed.  He would like to remain on aspirin.  Fasting today.  No new myalgias/side effects.  Lab Results  Component Value Date   CHOL 178 07/18/2020   HDL 63 07/18/2020   LDLCALC 99 07/18/2020   TRIG 87 07/18/2020   CHOLHDL 2.8 07/18/2020   Lab Results  Component Value Date   ALT 30 07/18/2020   AST 27 07/18/2020   ALKPHOS 105 07/18/2020   BILITOT 0.8 07/18/2020  repeat BP 144/82  Prediabetes: Borderline in August.  Walking almost everyday -1 hour per day.   Usually cooking at home. Wife is a Marine scientist. Avoiding sweets.  Brother diagnosed with DM later in life.  Home weights have been consistent. 180's at home.  Lab Results  Component Value Date   HGBA1C 5.7 (H) 07/18/2020  Body mass index is 27.41 kg/m.  Wt Readings from Last 3 Encounters:  01/16/21 191 lb (86.6 kg)  07/18/20 187 lb (84.8 kg)  12/02/18 192 lb (87.1 kg)    History Patient Active Problem List   Diagnosis Date Noted  . Cellulitis of foot, right 04/25/2014  . Closed fracture of fifth metatarsal bone of right foot 04/18/2014  . Closed fracture of the right second, third, and fifth proximal phalanges of the foot 04/18/2014  . Lipid disorder 03/29/2012  . Allergy history unknown 03/08/2012  . Bronchospasm 03/08/2012   Past Medical History:  Diagnosis Date  . Allergy   . Cataract   . Hyperlipidemia    Past Surgical History:  Procedure Laterality Date  . knee scoped     Allergies  Allergen Reactions  . Neosporin  [Neomycin-Bacitracin Zn-Polymyx] Other (See Comments)    Triple antibiotic - rash on skin   Prior to Admission medications   Medication Sig Start Date End Date Taking? Authorizing Provider  aspirin 81 MG tablet Take 81 mg by mouth daily.    [provider]  atorvastatin (LIPITOR) 10 MG tablet TAKE 1 TABLET(10 MG) BY MOUTH DAILY 07/18/20   Wendie Agreste, MD  Multiple Vitamins-Minerals (MULTIVITAMIN ADULT) TABS Take by mouth.    [provider]   Social History   Socioeconomic History  . Marital status: Married    Spouse name: Not on file  . Number of children: 2  . Years of education: Not on file  . Highest education level: Not on file  Occupational History  . Occupation: Retired  Tobacco Use  . Smoking status: Never Smoker  . Smokeless tobacco: Never Used  Vaping Use  . Vaping Use: Never used  Substance and Sexual Activity  . Alcohol use: No  . Drug use: No  . Sexual activity: Yes  Other Topics Concern  . Not on file  Social History Narrative   Married   Social Determinants of Health   Financial Resource Strain: Not on file  Food Insecurity: Not on file  Transportation Needs: Not on file  Physical Activity: Not on file  Stress: Not on file  Social  Connections: Not on file  Intimate Partner Violence: Not on file    Review of Systems  Constitutional: Negative for fatigue and unexpected weight change.  Eyes: Negative for visual disturbance.  Respiratory: Negative for cough, chest tightness and shortness of breath.   Cardiovascular: Negative for chest pain, palpitations and leg swelling.  Gastrointestinal: Negative for abdominal pain and blood in stool.  Neurological: Negative for dizziness, light-headedness and headaches.     Objective:   Vitals:   01/16/21 1117 01/16/21 1153  BP: (!) 176/92 (!) 144/82  Pulse: 73   Temp: (!) 97.4 F (36.3 C)   TempSrc: Temporal   SpO2: 98%   Weight: 191 lb (86.6 kg)   Height: 5\' 10"  (1.778 m)       Physical Exam Vitals reviewed.  Constitutional:      Appearance: Normal appearance. He is well-developed and well-nourished.  HENT:     Head: Normocephalic and atraumatic.  Eyes:     Extraocular Movements: EOM normal.     Pupils: Pupils are equal, round, and reactive to light.  Neck:     Vascular: No carotid bruit or JVD.  Cardiovascular:     Rate and Rhythm: Normal rate and regular rhythm.     Heart sounds: Normal heart sounds. No murmur heard.   Pulmonary:     Effort: Pulmonary effort is normal.     Breath sounds: Normal breath sounds. No rales.  Musculoskeletal:        General: No edema.  Skin:    General: Skin is warm and dry.  Neurological:     Mental Status: He is alert and oriented to person, place, and time.  Psychiatric:        Mood and Affect: Mood and affect normal.     Assessment & Plan:  Austin Watkins is a 75 y.o. male . Hyperlipidemia, unspecified hyperlipidemia type - Plan: Lipid panel, Comprehensive metabolic panel  -Tolerating current regimen, continue same.  Check labs.  7-month follow-up.  Prediabetes - Plan: Hemoglobin A1c  -Commended on walking, watching diet.  Home weights have been stable.  Recheck A1c today and in 6 months.  Monitor for BP elevation but borderline on recheck today.  Meds ordered this encounter  Medications  . atorvastatin (LIPITOR) 10 MG tablet    Sig: TAKE 1 TABLET(10 MG) BY MOUTH DAILY    Dispense:  90 tablet    Refill:  3   Patient Instructions   No change in medication at this time.  I will check the prediabetes test again but it was just borderline at last visit.  Follow-up in 6 months but let me know if there are questions sooner.  Keep up the good work with walking and watching diet.   If you have lab work done today you will be contacted with your lab results within the next 2 weeks.  If you have not heard from [place] then please contact us. The fastest way to get your results is to register for My Chart.   IF  you received an x-ray today, you will receive an invoice from [facility]. Please contact [facility] at [phone] with questions or concerns regarding your invoice.   IF you received labwork today, you will receive an invoice from [facility]. Please contact [facility] at [phone] with questions or concerns regarding your invoice.   Our billing staff will not be able to assist you with questions regarding bills from these companies.  You will be contacted with the lab results  as soon as they are available. The fastest way to get your results is to activate your My Chart account. Instructions are located on the last page of this paperwork. If you have not heard from [place] regarding the results in 2 weeks, please contact this office.         Signed, Merri Ray, MD [facility]

## 2021-01-17 LAB — LIPID PANEL
Chol/HDL Ratio: 3 ratio (ref 0.0–5.0)
Cholesterol, Total: 188 mg/dL (ref 100–199)
HDL: 62 mg/dL (ref >39)
LDL Chol Calc (NIH): 108 mg/dL — ABNORMAL HIGH (ref 0–99)
Triglycerides: 100 mg/dL (ref 0–149)
VLDL Cholesterol Cal: 18 mg/dL (ref 5–40)

## 2021-01-17 LAB — COMPREHENSIVE METABOLIC PANEL
ALT: 26 IU/L (ref 0–44)
AST: 23 IU/L (ref 0–40)
Albumin/Globulin Ratio: 1.6 (ref 1.2–2.2)
Albumin: 4.6 g/dL (ref 3.7–4.7)
Alkaline Phosphatase: 90 IU/L (ref 44–121)
BUN/Creatinine Ratio: 20 (ref 10–24)
BUN: 20 mg/dL (ref 8–27)
Bilirubin Total: 0.9 mg/dL (ref 0.0–1.2)
CO2: 23 mmol/L (ref 20–29)
Calcium: 9.4 mg/dL (ref 8.6–10.2)
Chloride: 101 mmol/L (ref 96–106)
Creatinine, Ser: 0.98 mg/dL (ref 0.76–1.27)
GFR calc Af Amer: 88 mL/min/{1.73_m2} (ref >59)
GFR calc non Af Amer: 76 mL/min/{1.73_m2} (ref >59)
Globulin, Total: 2.8 g/dL (ref 1.5–4.5)
Glucose: 97 mg/dL (ref 65–99)
Potassium: 4.6 mmol/L (ref 3.5–5.2)
Sodium: 139 mmol/L (ref 134–144)
Total Protein: 7.4 g/dL (ref 6.0–8.5)

## 2021-01-17 LAB — HEMOGLOBIN A1C
Est. average glucose Bld gHb Est-mCnc: 111 mg/dL
Hgb A1c MFr Bld: 5.5 % (ref 4.8–5.6)

## 2021-03-19 ENCOUNTER — Other Ambulatory Visit (HOSPITAL_BASED_OUTPATIENT_CLINIC_OR_DEPARTMENT_OTHER): Payer: Self-pay

## 2021-03-20 ENCOUNTER — Ambulatory Visit: Payer: Self-pay | Attending: Internal Medicine

## 2021-03-20 ENCOUNTER — Other Ambulatory Visit: Payer: Self-pay

## 2021-03-20 DIAGNOSIS — Z23 Encounter for immunization: Secondary | ICD-10-CM

## 2021-03-20 NOTE — Progress Notes (Signed)
   Covid-19 Vaccination Clinic  Name:  Austin Watkins    MRN: [id number] DOB: 1946-01-31  03/20/2021  [name] was observed post Covid-19 immunization for 15 minutes without incident. He was provided with Vaccine Information Sheet and instruction to access the V-Safe system.   [name] was instructed to call 911 with any severe reactions post vaccine: Marland Kitchen Difficulty breathing  . Swelling of face and throat  . A fast heartbeat  . A bad rash all over body  . Dizziness and weakness   Immunizations Administered    Name Date Dose VIS Date Route   PFIZER Comrnaty(Gray TOP) Covid-19 Vaccine 03/20/2021  3:23 PM 0.3 mL 11/08/2020 Intramuscular   Manufacturer: [employer], [employer]   Lot: [id number]   NDC: [phone]

## 2021-07-24 ENCOUNTER — Encounter: Payer: Self-pay | Admitting: Family Medicine

## 2021-07-24 ENCOUNTER — Ambulatory Visit (INDEPENDENT_AMBULATORY_CARE_PROVIDER_SITE_OTHER): Payer: Federal, State, Local not specified - PPO | Admitting: Family Medicine

## 2021-07-24 ENCOUNTER — Other Ambulatory Visit: Payer: Self-pay

## 2021-07-24 VITALS — BP 136/78 | HR 68 | Temp 98.2°F | Resp 16 | Ht 70.0 in | Wt 190.4 lb

## 2021-07-24 DIAGNOSIS — R739 Hyperglycemia, unspecified: Secondary | ICD-10-CM

## 2021-07-24 DIAGNOSIS — R7303 Prediabetes: Secondary | ICD-10-CM | POA: Diagnosis not present

## 2021-07-24 DIAGNOSIS — Z125 Encounter for screening for malignant neoplasm of prostate: Secondary | ICD-10-CM

## 2021-07-24 DIAGNOSIS — Z130 Encounter for screening for diseases of the blood and blood-forming organs and certain disorders involving the immune mechanism: Secondary | ICD-10-CM

## 2021-07-24 DIAGNOSIS — Z0000 Encounter for general adult medical examination without abnormal findings: Secondary | ICD-10-CM

## 2021-07-24 DIAGNOSIS — E785 Hyperlipidemia, unspecified: Secondary | ICD-10-CM

## 2021-07-24 LAB — LIPID PANEL
Cholesterol: 165 mg/dL (ref 0–200)
HDL: 60.2 mg/dL (ref >39.00)
LDL Cholesterol: 89 mg/dL (ref 0–99)
NonHDL: 104.94
Total CHOL/HDL Ratio: 3
Triglycerides: 79 mg/dL (ref 0.0–149.0)
VLDL: 15.8 mg/dL (ref 0.0–40.0)

## 2021-07-24 LAB — COMPREHENSIVE METABOLIC PANEL
ALT: 20 U/L (ref 0–53)
AST: 21 U/L (ref 0–37)
Albumin: 4.5 g/dL (ref 3.5–5.2)
Alkaline Phosphatase: 78 U/L (ref 39–117)
BUN: 22 mg/dL (ref 6–23)
CO2: 26 mEq/L (ref 19–32)
Calcium: 9.7 mg/dL (ref 8.4–10.5)
Chloride: 104 mEq/L (ref 96–112)
Creatinine, Ser: 0.84 mg/dL (ref 0.40–1.50)
GFR: 85.94 mL/min (ref >60.00)
Glucose, Bld: 76 mg/dL (ref 70–99)
Potassium: 4 mEq/L (ref 3.5–5.1)
Sodium: 140 mEq/L (ref 135–145)
Total Bilirubin: 1 mg/dL (ref 0.2–1.2)
Total Protein: 7.5 g/dL (ref 6.0–8.3)

## 2021-07-24 LAB — HEMOGLOBIN A1C: Hgb A1c MFr Bld: 5.7 % (ref 4.6–6.5)

## 2021-07-24 LAB — PSA: PSA: 2.78 ng/mL (ref 0.10–4.00)

## 2021-07-24 MED ORDER — ATORVASTATIN CALCIUM 10 MG PO TABS: ORAL_TABLET | ORAL | 3 refills | Status: DC

## 2021-07-24 NOTE — Patient Instructions (Signed)
No change in medications for now.  I will let you know if there are any concerns on your labs.  I did check PSA 1 additional time as slightly higher last year than previous range but as we discussed that test is voluntary at this point, and typically after age 73 may not need to test further.  If that level is elevated can meet with urology to discuss results and other further exam/testing.  Thanks for coming in today and take care.   Preventive Care 74 Years and Older, Male Preventive care refers to lifestyle choices and visits with your health care provider that can promote health and wellness. This includes: A yearly physical exam. This is also called an annual wellness visit. Regular dental and eye exams. Immunizations. Screening for certain conditions. Healthy lifestyle choices, such as: Eating a healthy diet. Getting regular exercise. Not using drugs or products that contain nicotine and tobacco. Limiting alcohol use. What can I expect for my preventive care visit? Physical exam Your health care provider will check your: Height and weight. These may be used to calculate your BMI (body mass index). BMI is a measurement that tells if you are at a healthy weight. Heart rate and blood pressure. Body temperature. Skin for abnormal spots. Counseling Your health care provider may ask you questions about your: Past medical problems. Family's medical history. Alcohol, tobacco, and drug use. Emotional well-being. Home life and relationship well-being. Sexual activity. Diet, exercise, and sleep habits. History of falls. Memory and ability to understand (cognition). Work and work Statistician. Access to firearms. What immunizations do I need?  Vaccines are usually given at various ages, according to a schedule. Your health care provider will recommend vaccines for you based on your age, medicalhistory, and lifestyle or other factors, such as travel or where you work. What tests do I  need? Blood tests Lipid and cholesterol levels. These may be checked every 5 years, or more often depending on your overall health. Hepatitis C test. Hepatitis B test. Screening Lung cancer screening. You may have this screening every year starting at age 49 if you have a 30-pack-year history of smoking and currently smoke or have quit within the past 15 years. Colorectal cancer screening. All adults should have this screening starting at age 27 and continuing until age 33. Your health care provider may recommend screening at age 6 if you are at increased risk. You will have tests every 1-10 years, depending on your results and the type of screening test. Prostate cancer screening. Recommendations will vary depending on your family history and other risks. Genital exam to check for testicular cancer or hernias. Diabetes screening. This is done by checking your blood sugar (glucose) after you have not eaten for a while (fasting). You may have this done every 1-3 years. Abdominal aortic aneurysm (AAA) screening. You may need this if you are a current or former smoker. STD (sexually transmitted disease) testing, if you are at risk. Follow these instructions at home: Eating and drinking  Eat a diet that includes fresh fruits and vegetables, whole grains, lean protein, and low-fat dairy products. Limit your intake of foods with high amounts of sugar, saturated fats, and salt. Take vitamin and mineral supplements as recommended by your health care provider. Do not drink alcohol if your health care provider tells you not to drink. If you drink alcohol: Limit how much you have to 0-2 drinks a day. Be aware of how much alcohol is in your drink. In the  [place] one drink equals one 12 oz bottle of beer (355 mL), one 5 oz glass of wine (148 mL), or one 1 oz glass of hard liquor (44 mL).  Lifestyle Take daily care of your teeth and gums. Brush your teeth every morning and night with fluoride  toothpaste. Floss one time each day. Stay active. Exercise for at least 30 minutes 5 or more days each week. Do not use any products that contain nicotine or tobacco, such as cigarettes, e-cigarettes, and chewing tobacco. If you need help quitting, ask your health care provider. Do not use drugs. If you are sexually active, practice safe sex. Use a condom or other form of protection to prevent STIs (sexually transmitted infections). Talk with your health care provider about taking a low-dose aspirin or statin. Find healthy ways to cope with stress, such as: Meditation, yoga, or listening to music. Journaling. Talking to a trusted person. Spending time with friends and family. Safety Always wear your seat belt while driving or riding in a vehicle. Do not drive: If you have been drinking alcohol. Do not ride with someone who has been drinking. When you are tired or distracted. While texting. Wear a helmet and other protective equipment during sports activities. If you have firearms in your house, make sure you follow all gun safety procedures. What's next? Visit your health care provider once a year for an annual wellness visit. Ask your health care provider how often you should have your eyes and teeth checked. Stay up to date on all vaccines. This information is not intended to replace advice given to you by your health care provider. Make sure you discuss any questions you have with your healthcare provider. Document Revised: 08/16/2019 Document Reviewed: 11/11/2018 Elsevier Patient Education  2022 [employer].

## 2021-07-24 NOTE — Progress Notes (Signed)
Subjective:  Patient ID: Austin Watkins, male    DOB: 01/10/1946  Age: 75 y.o. MRN: [id number]  CC:  Chief Complaint  Patient presents with   Medicare Wellness    Pt doing well, no concerns today     HPI Austin Watkins presents for   Presents for annual wellness exam. No new health concerns or changes.   Care team: PCP: me Ophthalmology, Dr. Trinna Post, vitreous floaters, hyperopia, dermatochalasis of upper eyelids and cataracts of both eyes. Last appt a week ago. No plans for cataract removal at this time.   Hyperglycemia Prior elevation, normal last year.  Normal A1c.  Walking daily for exercise, usually cooking at home.  Avoid sweets. Lab Results  Component Value Date   HGBA1C 5.5 01/16/2021   Hyperlipidemia: Lipitor 10 mg daily, denies any myalgias/side effects.  Has remained on aspirin after previous discussion of risks and benefits of aspirin use.  Walking daily for exercise.  Lab Results  Component Value Date   CHOL 188 01/16/2021   HDL 62 01/16/2021   LDLCALC 108 (H) 01/16/2021   TRIG 100 01/16/2021   CHOLHDL 3.0 01/16/2021   Lab Results  Component Value Date   ALT 26 01/16/2021   AST 23 01/16/2021   ALKPHOS 90 01/16/2021   BILITOT 0.9 01/16/2021      Fall screening Fall Risk  07/24/2021 01/16/2021 07/18/2020 12/02/2018 02/13/2018  Falls in the past year? 0 0 0 0 No  Number falls in past yr: 0 - - - -  Injury with Fall? 0 - - - -  Risk for fall due to : No Fall Risks - - - -  Follow up Falls evaluation completed Falls evaluation completed Falls evaluation completed - -   Lighting in home: adequate.  Loose rugs/carpets/pets: few loose rugs- considering removing. No pets.  Stairs:single level.  Grab bars in bathroom: none at this time.  Timed up and go:8 seconds, normal gait, no instability.   Depression Screening: Depression screen [facility] 2/9 07/24/2021 01/16/2021 07/18/2020 12/02/2018 02/13/2018  Decreased Interest 0 0 0 0 0  Down, Depressed, Hopeless 0 0 0 0 0   PHQ - 2 Score 0 0 0 0 0    Cancer Screening: Colonoscopy 12/09/17 - normal, few diverticula, no repeat testing planned.  No FH or personal hx of prostate cancer.  The natural history of prostate cancer and ongoing controversy regarding screening and potential treatment outcomes of prostate cancer has been discussed with the patient. The meaning of a false positive PSA and a false negative PSA has been discussed. He indicates understanding of the limitations of this screening test and wishes to proceed with screening PSA testing.declines DRE.   Lab Results  Component Value Date   PSA1 3.2 07/18/2020   PSA1 2.3 12/02/2018   PSA1 2.4 11/19/2017   PSA 2.12 11/05/2015   PSA 1.89 09/04/2014   PSA 1.48 03/28/2013    Immunization History  Administered Date(s) Administered   PFIZER Comirnaty(Gray Top)Covid-19 Tri-Sucrose Vaccine 03/20/2021   PFIZER(Purple Top)SARS-COV-2 Vaccination 01/15/2020, 02/07/2020, 09/15/2020   Td 12/02/2007  Declines pneumonia vaccine.  Declines shingles vaccine.   Functional Status Survey: Is the patient deaf or have difficulty hearing?: No Does the patient have difficulty seeing, even when wearing glasses/contacts?: No Does the patient have difficulty concentrating, remembering, or making decisions?: No Does the patient have difficulty walking or climbing stairs?: No Does the patient have difficulty dressing or bathing?: No Does the patient have difficulty doing errands alone such  as visiting a doctor's office or shopping?: No  Memory Screen: 6CIT Screen 07/24/2021 07/18/2020 12/02/2018 11/19/2017  What Year? 0 points 0 points 0 points 0 points  What month? 0 points 0 points 0 points 0 points  What time? 0 points 0 points 0 points 0 points  Count back from 20 0 points 0 points 0 points 0 points  Months in reverse 0 points 0 points 0 points 0 points  Repeat phrase 0 points 0 points 2 points 0 points  Total Score 0 0 2 0    Alcohol Screening: [place] Visit from 07/24/2021 in [place]  AUDIT-C Score 0      Tobacco: None.   Vision Screening   Right eye Left eye Both eyes  Without correction     With correction '20/20 20/20 20/20 '$  Optho/optometry following as above.   Dental: Recent root canal, 6 month follow ups.   Exercise: Walking daily. 1hr. 3 miles.   Advanced Directives:  Has living will and hcpoa   History Patient Active Problem List   Diagnosis Date Noted   Cellulitis of foot, right 04/25/2014   Closed fracture of fifth metatarsal bone of right foot 04/18/2014   Closed fracture of the right second, third, and fifth proximal phalanges of the foot 04/18/2014   Lipid disorder 03/29/2012   Allergy history unknown 03/08/2012   Bronchospasm 03/08/2012   Past Medical History:  Diagnosis Date   Allergy    Cataract    Hyperlipidemia    Past Surgical History:  Procedure Laterality Date   knee scoped     Allergies  Allergen Reactions   Neosporin [Neomycin-Bacitracin Zn-Polymyx] Other (See Comments)    Triple antibiotic - rash on skin   Prior to Admission medications   Medication Sig Start Date End Date Taking? Authorizing Provider  aspirin 81 MG tablet Take 81 mg by mouth daily.   Yes [provider]  atorvastatin (LIPITOR) 10 MG tablet TAKE 1 TABLET(10 MG) BY MOUTH DAILY 01/16/21  Yes Wendie Agreste, MD  Multiple Vitamins-Minerals (MULTIVITAMIN ADULT) TABS Take by mouth.   Yes [provider]   Social History   Socioeconomic History   Marital status: Married    Spouse name: Not on file   Number of children: 2   Years of education: Not on file   Highest education level: Not on file  Occupational History   Occupation: Retired  Tobacco Use   Smoking status: Never   Smokeless tobacco: Never  Vaping Use   Vaping Use: Never used  Substance and Sexual Activity   Alcohol use: No   Drug use: No   Sexual activity: Yes  Other Topics  Concern   Not on file  Social History Narrative   Married   Social Determinants of Health   Financial Resource Strain: Not on file  Food Insecurity: Not on file  Transportation Needs: Not on file  Physical Activity: Not on file  Stress: Not on file  Social Connections: Not on file  Intimate Partner Violence: Not on file    Review of Systems 13 point review of systems per patient health survey noted.  Negative other than as indicated above or in HPI.    Objective:   Vitals:   07/24/21 0938  BP: 136/78  Pulse: 68  Resp: 16  Temp: 98.2 F (36.8 C)  TempSrc: Temporal  SpO2: 96%  Weight: 190 lb 6.4 oz (86.4 kg)  Height: '5\' 10"'$  (1.778 m)  Physical Exam Vitals reviewed.  Constitutional:      Appearance: He is well-developed.  HENT:     Head: Normocephalic and atraumatic.     Right Ear: External ear normal.     Left Ear: External ear normal.     Ears:     Comments: Excess cerumen bilateral canals but not completely obstructed.  Able to hear independently. Eyes:     Conjunctiva/sclera: Conjunctivae normal.     Pupils: Pupils are equal, round, and reactive to light.  Neck:     Thyroid: No thyromegaly.  Cardiovascular:     Rate and Rhythm: Normal rate and regular rhythm.     Heart sounds: Normal heart sounds.  Pulmonary:     Effort: Pulmonary effort is normal. No respiratory distress.     Breath sounds: Normal breath sounds. No wheezing.  Abdominal:     General: There is no distension.     Palpations: Abdomen is soft.     Tenderness: There is no abdominal tenderness.  Musculoskeletal:        General: No tenderness. Normal range of motion.     Cervical back: Normal range of motion and neck supple.  Lymphadenopathy:     Cervical: No cervical adenopathy.  Skin:    General: Skin is warm and dry.  Neurological:     Mental Status: He is alert and oriented to person, place, and time.     Deep Tendon Reflexes: Reflexes are normal and symmetric.  Psychiatric:         Mood and Affect: Mood normal.        Behavior: Behavior normal.       Assessment & Plan:  Austin Watkins is a 75 y.o. male . Medicare annual wellness visit, subsequent  - - anticipatory guidance as below in AVS, screening labs if needed. Health maintenance items as above in HPI discussed/recommended as applicable.  - no concerning responses on depression, fall, or functional status screening. Any positive responses noted as above. Advanced directives discussed as in CHL.   Hyperlipidemia, unspecified hyperlipidemia type  -Tolerating current regimen, continue Lipitor same dose, check labs, 1 year follow-up if stable.  Prediabetes Hyperglycemia  -Prior elevations but most recent A1c is reassuring, repeat labs.  Commended on diet, exercise.  1 year follow-up if stable.  Meds ordered this encounter  Medications   atorvastatin (LIPITOR) 10 MG tablet    Sig: TAKE 1 TABLET(10 MG) BY MOUTH DAILY    Dispense:  90 tablet    Refill:  3   Patient Instructions  No change in medications for now.  I will let you know if there are any concerns on your labs.  I did check PSA 1 additional time as slightly higher last year than previous range but as we discussed that test is voluntary at this point, and typically after age 43 may not need to test further.  If that level is elevated can meet with urology to discuss results and other further exam/testing.  Thanks for coming in today and take care.   Preventive Care 62 Years and Older, Male Preventive care refers to lifestyle choices and visits with your health care provider that can promote health and wellness. This includes: A yearly physical exam. This is also called an annual wellness visit. Regular dental and eye exams. Immunizations. Screening for certain conditions. Healthy lifestyle choices, such as: Eating a healthy diet. Getting regular exercise. Not using drugs or products that contain nicotine and tobacco. Limiting alcohol use. What  can I expect for my preventive care visit? Physical exam Your health care provider will check your: Height and weight. These may be used to calculate your BMI (body mass index). BMI is a measurement that tells if you are at a healthy weight. Heart rate and blood pressure. Body temperature. Skin for abnormal spots. Counseling Your health care provider may ask you questions about your: Past medical problems. Family's medical history. Alcohol, tobacco, and drug use. Emotional well-being. Home life and relationship well-being. Sexual activity. Diet, exercise, and sleep habits. History of falls. Memory and ability to understand (cognition). Work and work Statistician. Access to firearms. What immunizations do I need?  Vaccines are usually given at various ages, according to a schedule. Your health care provider will recommend vaccines for you based on your age, medicalhistory, and lifestyle or other factors, such as travel or where you work. What tests do I need? Blood tests Lipid and cholesterol levels. These may be checked every 5 years, or more often depending on your overall health. Hepatitis C test. Hepatitis B test. Screening Lung cancer screening. You may have this screening every year starting at age 50 if you have a 30-pack-year history of smoking and currently smoke or have quit within the past 15 years. Colorectal cancer screening. All adults should have this screening starting at age 60 and continuing until age 59. Your health care provider may recommend screening at age 30 if you are at increased risk. You will have tests every 1-10 years, depending on your results and the type of screening test. Prostate cancer screening. Recommendations will vary depending on your family history and other risks. Genital exam to check for testicular cancer or hernias. Diabetes screening. This is done by checking your blood sugar (glucose) after you have not eaten for a while  (fasting). You may have this done every 1-3 years. Abdominal aortic aneurysm (AAA) screening. You may need this if you are a current or former smoker. STD (sexually transmitted disease) testing, if you are at risk. Follow these instructions at home: Eating and drinking  Eat a diet that includes fresh fruits and vegetables, whole grains, lean protein, and low-fat dairy products. Limit your intake of foods with high amounts of sugar, saturated fats, and salt. Take vitamin and mineral supplements as recommended by your health care provider. Do not drink alcohol if your health care provider tells you not to drink. If you drink alcohol: Limit how much you have to 0-2 drinks a day. Be aware of how much alcohol is in your drink. In the [place] one drink equals one 12 oz bottle of beer (355 mL), one 5 oz glass of wine (148 mL), or one 1 oz glass of hard liquor (44 mL).  Lifestyle Take daily care of your teeth and gums. Brush your teeth every morning and night with fluoride toothpaste. Floss one time each day. Stay active. Exercise for at least 30 minutes 5 or more days each week. Do not use any products that contain nicotine or tobacco, such as cigarettes, e-cigarettes, and chewing tobacco. If you need help quitting, ask your health care provider. Do not use drugs. If you are sexually active, practice safe sex. Use a condom or other form of protection to prevent STIs (sexually transmitted infections). Talk with your health care provider about taking a low-dose aspirin or statin. Find healthy ways to cope with stress, such as: Meditation, yoga, or listening to music. Journaling. Talking to a trusted person. Spending time with friends and family.  Safety Always wear your seat belt while driving or riding in a vehicle. Do not drive: If you have been drinking alcohol. Do not ride with someone who has been drinking. When you are tired or distracted. While texting. Wear a helmet and other protective  equipment during sports activities. If you have firearms in your house, make sure you follow all gun safety procedures. What's next? Visit your health care provider once a year for an annual wellness visit. Ask your health care provider how often you should have your eyes and teeth checked. Stay up to date on all vaccines. This information is not intended to replace advice given to you by your health care provider. Make sure you discuss any questions you have with your healthcare provider. Document Revised: 08/16/2019 Document Reviewed: 11/11/2018 Elsevier Patient Education  2022 [place],   Merri Ray, MD [place], [facility] 07/24/21 10:49 AM

## 2021-08-30 ENCOUNTER — Other Ambulatory Visit: Payer: Self-pay

## 2021-08-30 ENCOUNTER — Ambulatory Visit: Payer: Federal, State, Local not specified - PPO | Attending: Internal Medicine

## 2021-08-30 ENCOUNTER — Other Ambulatory Visit (HOSPITAL_BASED_OUTPATIENT_CLINIC_OR_DEPARTMENT_OTHER): Payer: Self-pay

## 2021-08-30 DIAGNOSIS — Z23 Encounter for immunization: Secondary | ICD-10-CM

## 2021-08-30 MED ORDER — PFIZER COVID-19 VAC BIVALENT 30 MCG/0.3ML IM SUSP
INTRAMUSCULAR | 0 refills | Status: DC
  Filled 2021-08-30: qty 0.3, 1d supply, fill #0

## 2021-08-30 NOTE — Progress Notes (Signed)
   Covid-19 Vaccination Clinic  Name:  Austin Watkins    MRN: [id number] DOB: Jan 24, 1946  08/30/2021  Mr. Austin Watkins was observed post Covid-19 immunization for 15 minutes without incident. He was provided with Vaccine Information Sheet and instruction to access the V-Safe system.   Mr. Austin Watkins was instructed to call 911 with any severe reactions post vaccine: Difficulty breathing  Swelling of face and throat  A fast heartbeat  A bad rash all over body  Dizziness and weakness

## 2022-07-30 ENCOUNTER — Ambulatory Visit: Payer: Federal, State, Local not specified - PPO | Admitting: Family Medicine

## 2022-07-30 ENCOUNTER — Encounter: Payer: Self-pay | Admitting: Family Medicine

## 2022-07-30 VITALS — BP 138/78 | HR 71 | Temp 98.2°F | Ht 70.0 in | Wt 192.2 lb

## 2022-07-30 DIAGNOSIS — R252 Cramp and spasm: Secondary | ICD-10-CM

## 2022-07-30 DIAGNOSIS — Z0000 Encounter for general adult medical examination without abnormal findings: Secondary | ICD-10-CM

## 2022-07-30 DIAGNOSIS — Z125 Encounter for screening for malignant neoplasm of prostate: Secondary | ICD-10-CM

## 2022-07-30 DIAGNOSIS — E785 Hyperlipidemia, unspecified: Secondary | ICD-10-CM | POA: Diagnosis not present

## 2022-07-30 DIAGNOSIS — Z1329 Encounter for screening for other suspected endocrine disorder: Secondary | ICD-10-CM

## 2022-07-30 DIAGNOSIS — K644 Residual hemorrhoidal skin tags: Secondary | ICD-10-CM

## 2022-07-30 DIAGNOSIS — R739 Hyperglycemia, unspecified: Secondary | ICD-10-CM

## 2022-07-30 MED ORDER — ATORVASTATIN CALCIUM 10 MG PO TABS: ORAL_TABLET | ORAL | 3 refills | Status: DC

## 2022-07-30 NOTE — Patient Instructions (Addendum)
No change in atorvastatin for now, I will check some labs today.  If you notice that hand cramping occurring more often please schedule visit with me and we can discuss that further.  Frequent care rides or harder stools may cause hemorrhoids, glad to hear it is better. If current swollen area does not improve in next few weeks, let me know.   About Hemorrhoids  Hemorrhoids are swollen veins in the lower rectum and anus.  Also called piles, hemorrhoids are a common problem.  Hemorrhoids may be internal (inside the rectum) or external (around the anus).  Internal Hemorrhoids  Internal hemorrhoids are often painless, but they rarely cause bleeding.  The internal veins may stretch and fall down (prolapse) through the anus to the outside of the body.  The veins may then become irritated and painful.  External Hemorrhoids  External hemorrhoids can be easily seen or felt around the anal opening.  They are under the skin around the anus.  When the swollen veins are scratched or broken by straining, rubbing or wiping they sometimes bleed.  How Hemorrhoids Occur  Veins in the rectum and around the anus tend to swell under pressure.  Hemorrhoids can result from increased pressure in the veins of your anus or rectum.  Some sources of pressure are:  Straining to have a bowel movement because of constipation Waiting too long to have a bowel movement Coughing and sneezing often Sitting for extended periods of time, including on the toilet Diarrhea Obesity Trauma or injury to the anus Some liver diseases Stress Family history of hemorrhoids Pregnancy  Pregnant women should try to avoid becoming constipated, because they are more likely to have hemorrhoids during pregnancy.  In the last trimester of pregnancy, the enlarged uterus may press on blood vessels and causes hemorrhoids.  In addition, the strain of childbirth sometimes causes hemorrhoids after the birth.  Symptoms of Hemorrhoids  Some  symptoms of hemorrhoids include: Swelling and/or a tender lump around the anus Itching, mild burning and bleeding around the anus Painful bowel movements with or without constipation Bright red blood covering the stool, on toilet paper or in the toilet bowel.   Symptoms usually go away within a few days.  Always talk to your doctor about any bleeding to make sure it is not from some other causes.  Diagnosing and Treating Hemorrhoids  Diagnosis is made by an examination by your healthcare provider.  Special test can be performed by your doctor.    Most cases of hemorrhoids can be treated with: High-fiber diet: Eat more high-fiber foods, which help prevent constipation.  Ask for more detailed fiber information on types and sources of fiber from your healthcare provider. Fluids: Drink plenty of water.  This helps soften bowel movements so they are easier to pass. Sitz baths and cold packs: Sitting in lukewarm water two or three times a day for 15 minutes cleases the anal area and may relieve discomfort.  If the water is too hot, swelling around the anus will get worse.  Placing a cloth-covered ice pack on the anus for ten minutes four times a day can also help reduce selling.  Gently pushing a prolapsed hemorrhoid back inside after the bath or ice pack can be helpful. Medications: For mild discomfort, your healthcare provider may suggest over-the-counter pain medication or prescribe a cream or ointment for topical use.  The cream may contain witch hazel, zinc oxide or petroleum jelly.  Medicated suppositories are also a treatment option.  Always  consult your doctor before applying medications or creams. Procedures and surgeries: There are also a number of procedures and surgeries to shrink or remove hemorrhoids in more serious cases.  Talk to your physician about these options.  You can often prevent hemorrhoids or keep them from becoming worse by maintaining a healthy lifestyle.  Eat a fiber-rich  diet of fruits, vegetables and whole grains.  Also, drink plenty of water and exercise regularly.   2007, [employer] Doc.30

## 2022-07-30 NOTE — Progress Notes (Signed)
Subjective:  Patient ID: Austin Watkins, male    DOB: Oct 25, 1946  Age: 76 y.o. MRN: [id number]  CC:  Chief Complaint  Patient presents with   Hyperlipidemia        Hemorrhoids    Pt states he has an issues with a Hemorid, he has been taking a sitz bath along with using preparation H and states it has been helping      HPI [name] presents for   Hyperlipidemia: Lipitor '10mg'$  qd. No new myalgias or side effects. Rare left hand cramps. Sporadic - few times in past year. Resolves in few minutes.  Lab Results  Component Value Date   CHOL 165 07/24/2021   HDL 60.20 07/24/2021   LDLCALC 89 07/24/2021   TRIG 79.0 07/24/2021   CHOLHDL 3 07/24/2021   Lab Results  Component Value Date   ALT 20 07/24/2021   AST 21 07/24/2021   ALKPHOS 78 07/24/2021   BILITOT 1.0 07/24/2021   Hemorrhoids 2 weeks ago had flare, swollen area at anus. No pain, itching, or bleeding. Just swollen area. some hard stools prior, but not straining. Some increased travel.  Tx: preparation H suppository, wipes and sitz bath. Swelling improved.  Hemorrhoid years ago - felt same as recent symptoms.    History Patient Active Problem List   Diagnosis Date Noted   Cellulitis of foot, right 04/25/2014   Closed fracture of fifth metatarsal bone of right foot 04/18/2014   Closed fracture of the right second, third, and fifth proximal phalanges of the foot 04/18/2014   Lipid disorder 03/29/2012   Allergy history unknown 03/08/2012   Bronchospasm 03/08/2012   Past Medical History:  Diagnosis Date   Allergy    Cataract    Hyperlipidemia    Past Surgical History:  Procedure Laterality Date   knee scoped     Allergies  Allergen Reactions   Neosporin [Neomycin-Bacitracin Zn-Polymyx] Other (See Comments)    Triple antibiotic - rash on skin   Prior to Admission medications   Medication Sig Start Date End Date Taking? Authorizing Provider  aspirin 81 MG tablet Take 81 mg by mouth daily.   Yes [provider]  atorvastatin (LIPITOR) 10 MG tablet TAKE 1 TABLET(10 MG) BY MOUTH DAILY 07/24/21  Yes Wendie Agreste, MD  Multiple Vitamins-Minerals (MULTIVITAMIN ADULT) TABS Take by mouth.   Yes [provider]  COVID-19 mRNA bivalent vaccine, Pfizer, (PFIZER COVID-19 VAC BIVALENT) injection Inject into the muscle. 08/30/21   Carlyle Basques, MD   Social History   Socioeconomic History   Marital status: Married    Spouse name: Not on file   Number of children: 2   Years of education: Not on file   Highest education level: Not on file  Occupational History   Occupation: Retired  Tobacco Use   Smoking status: Never   Smokeless tobacco: Never  Vaping Use   Vaping Use: Never used  Substance and Sexual Activity   Alcohol use: No   Drug use: No   Sexual activity: Yes  Other Topics Concern   Not on file  Social History Narrative   Married   Social Determinants of Health   Financial Resource Strain: Not on file  Food Insecurity: Not on file  Transportation Needs: Not on file  Physical Activity: Not on file  Stress: Not on file  Social Connections: Not on file  Intimate Partner Violence: Not on file    Review of Systems   Objective:  Vitals:   07/30/22 1405 07/30/22 1422  BP: (!) 140/82 138/78  Pulse: 71   Temp: 98.2 F (36.8 C)   SpO2: 99%   Weight: 192 lb 3.2 oz (87.2 kg)   Height: '5\' 10"'$  (1.778 m)      Physical Exam Vitals reviewed.  Constitutional:      Appearance: He is well-developed.  HENT:     Head: Normocephalic and atraumatic.  Neck:     Vascular: No carotid bruit or JVD.  Cardiovascular:     Rate and Rhythm: Normal rate and regular rhythm.     Heart sounds: Normal heart sounds. No murmur heard. Pulmonary:     Effort: Pulmonary effort is normal.     Breath sounds: Normal breath sounds. No rales.  Genitourinary:    Comments: Nonbleeding external hemorrhoid at approximately 3:00, nontender.  Firmness centrally. Musculoskeletal:      Right lower leg: No edema.     Left lower leg: No edema.  Skin:    General: Skin is warm and dry.  Neurological:     Mental Status: He is alert and oriented to person, place, and time.  Psychiatric:        Mood and Affect: Mood normal.        Assessment & Plan:  Austin Watkins is a 76 y.o. male . Hyperlipidemia, unspecified hyperlipidemia type - Plan: atorvastatin (LIPITOR) 10 MG tablet, Lipid panel, Comprehensive metabolic panel  -Tolerating current dose, check updated labs.  Continue same, Lipitor 10 mg daily.  Unlikely cause of hand cramps/symptoms.  Hand cramp  -Improved symptoms.  Check electrolytes as above, RTC precautions if more persistent/frequent or other areas involved.  External hemorrhoid  -Possible component of thrombosed hemorrhoid but nontender, no bleeding.  Improving subjectively.  Continue symptomatic care, avoidance of straining, handout given with RTC precautions.  Meds ordered this encounter  Medications   atorvastatin (LIPITOR) 10 MG tablet    Sig: TAKE 1 TABLET(10 MG) BY MOUTH DAILY    Dispense:  90 tablet    Refill:  3   Patient Instructions  No change in atorvastatin for now, I will check some labs today.  If you notice that hand cramping occurring more often please schedule visit with me and we can discuss that further.  Frequent care rides or harder stools may cause hemorrhoids, glad to hear it is better. If current swollen area does not improve in next few weeks, let me know.   About Hemorrhoids  Hemorrhoids are swollen veins in the lower rectum and anus.  Also called piles, hemorrhoids are a common problem.  Hemorrhoids may be internal (inside the rectum) or external (around the anus).  Internal Hemorrhoids  Internal hemorrhoids are often painless, but they rarely cause bleeding.  The internal veins may stretch and fall down (prolapse) through the anus to the outside of the body.  The veins may then become irritated and painful.  External  Hemorrhoids  External hemorrhoids can be easily seen or felt around the anal opening.  They are under the skin around the anus.  When the swollen veins are scratched or broken by straining, rubbing or wiping they sometimes bleed.  How Hemorrhoids Occur  Veins in the rectum and around the anus tend to swell under pressure.  Hemorrhoids can result from increased pressure in the veins of your anus or rectum.  Some sources of pressure are:  Straining to have a bowel movement because of constipation Waiting too long to have a bowel movement Coughing  and sneezing often Sitting for extended periods of time, including on the toilet Diarrhea Obesity Trauma or injury to the anus Some liver diseases Stress Family history of hemorrhoids Pregnancy  Pregnant women should try to avoid becoming constipated, because they are more likely to have hemorrhoids during pregnancy.  In the last trimester of pregnancy, the enlarged uterus may press on blood vessels and causes hemorrhoids.  In addition, the strain of childbirth sometimes causes hemorrhoids after the birth.  Symptoms of Hemorrhoids  Some symptoms of hemorrhoids include: Swelling and/or a tender lump around the anus Itching, mild burning and bleeding around the anus Painful bowel movements with or without constipation Bright red blood covering the stool, on toilet paper or in the toilet bowel.   Symptoms usually go away within a few days.  Always talk to your doctor about any bleeding to make sure it is not from some other causes.  Diagnosing and Treating Hemorrhoids  Diagnosis is made by an examination by your healthcare provider.  Special test can be performed by your doctor.    Most cases of hemorrhoids can be treated with: High-fiber diet: Eat more high-fiber foods, which help prevent constipation.  Ask for more detailed fiber information on types and sources of fiber from your healthcare provider. Fluids: Drink plenty of water.  This  helps soften bowel movements so they are easier to pass. Sitz baths and cold packs: Sitting in lukewarm water two or three times a day for 15 minutes cleases the anal area and may relieve discomfort.  If the water is too hot, swelling around the anus will get worse.  Placing a cloth-covered ice pack on the anus for ten minutes four times a day can also help reduce selling.  Gently pushing a prolapsed hemorrhoid back inside after the bath or ice pack can be helpful. Medications: For mild discomfort, your healthcare provider may suggest over-the-counter pain medication or prescribe a cream or ointment for topical use.  The cream may contain witch hazel, zinc oxide or petroleum jelly.  Medicated suppositories are also a treatment option.  Always consult your doctor before applying medications or creams. Procedures and surgeries: There are also a number of procedures and surgeries to shrink or remove hemorrhoids in more serious cases.  Talk to your physician about these options.  You can often prevent hemorrhoids or keep them from becoming worse by maintaining a healthy lifestyle.  Eat a fiber-rich diet of fruits, vegetables and whole grains.  Also, drink plenty of water and exercise regularly.   2007, [employer]     Signed,   Merri Ray, MD [place] 07/30/22 3:13 PM

## 2022-07-31 LAB — COMPREHENSIVE METABOLIC PANEL
ALT: 21 U/L (ref 0–53)
AST: 21 U/L (ref 0–37)
Albumin: 4.4 g/dL (ref 3.5–5.2)
Alkaline Phosphatase: 68 U/L (ref 39–117)
BUN: 26 mg/dL — ABNORMAL HIGH (ref 6–23)
CO2: 28 mEq/L (ref 19–32)
Calcium: 9.4 mg/dL (ref 8.4–10.5)
Chloride: 104 mEq/L (ref 96–112)
Creatinine, Ser: 0.86 mg/dL (ref 0.40–1.50)
GFR: 84.72 mL/min (ref >60.00)
Glucose, Bld: 96 mg/dL (ref 70–99)
Potassium: 4.8 mEq/L (ref 3.5–5.1)
Sodium: 139 mEq/L (ref 135–145)
Total Bilirubin: 0.7 mg/dL (ref 0.2–1.2)
Total Protein: 7.4 g/dL (ref 6.0–8.3)

## 2022-07-31 LAB — LIPID PANEL
Cholesterol: 175 mg/dL (ref 0–200)
HDL: 59.2 mg/dL (ref >39.00)
LDL Cholesterol: 96 mg/dL (ref 0–99)
NonHDL: 116.26
Total CHOL/HDL Ratio: 3
Triglycerides: 103 mg/dL (ref 0.0–149.0)
VLDL: 20.6 mg/dL (ref 0.0–40.0)

## 2022-10-03 ENCOUNTER — Other Ambulatory Visit (HOSPITAL_BASED_OUTPATIENT_CLINIC_OR_DEPARTMENT_OTHER): Payer: Self-pay

## 2022-10-03 MED ORDER — COMIRNATY 30 MCG/0.3ML IM SUSY
PREFILLED_SYRINGE | INTRAMUSCULAR | 0 refills | Status: DC
  Filled 2022-10-03: qty 0.3, 1d supply, fill #0

## 2023-01-15 ENCOUNTER — Ambulatory Visit (INDEPENDENT_AMBULATORY_CARE_PROVIDER_SITE_OTHER): Payer: Federal, State, Local not specified - PPO | Admitting: Family Medicine

## 2023-01-15 ENCOUNTER — Encounter: Payer: Self-pay | Admitting: Family Medicine

## 2023-01-15 VITALS — BP 138/76 | HR 68 | Temp 97.8°F | Ht 69.0 in | Wt 193.4 lb

## 2023-01-15 DIAGNOSIS — E785 Hyperlipidemia, unspecified: Secondary | ICD-10-CM | POA: Diagnosis not present

## 2023-01-15 DIAGNOSIS — R7303 Prediabetes: Secondary | ICD-10-CM

## 2023-01-15 DIAGNOSIS — R739 Hyperglycemia, unspecified: Secondary | ICD-10-CM

## 2023-01-15 DIAGNOSIS — Z0000 Encounter for general adult medical examination without abnormal findings: Secondary | ICD-10-CM

## 2023-01-15 LAB — COMPREHENSIVE METABOLIC PANEL
ALT: 19 U/L (ref 0–53)
AST: 24 U/L (ref 0–37)
Albumin: 4.5 g/dL (ref 3.5–5.2)
Alkaline Phosphatase: 69 U/L (ref 39–117)
BUN: 24 mg/dL — ABNORMAL HIGH (ref 6–23)
CO2: 30 mEq/L (ref 19–32)
Calcium: 9.9 mg/dL (ref 8.4–10.5)
Chloride: 103 mEq/L (ref 96–112)
Creatinine, Ser: 0.93 mg/dL (ref 0.40–1.50)
GFR: 80.08 mL/min (ref >60.00)
Glucose, Bld: 90 mg/dL (ref 70–99)
Potassium: 4.9 mEq/L (ref 3.5–5.1)
Sodium: 140 mEq/L (ref 135–145)
Total Bilirubin: 0.8 mg/dL (ref 0.2–1.2)
Total Protein: 7.7 g/dL (ref 6.0–8.3)

## 2023-01-15 LAB — HEMOGLOBIN A1C: Hgb A1c MFr Bld: 5.8 % (ref 4.6–6.5)

## 2023-01-15 LAB — LIPID PANEL
Cholesterol: 176 mg/dL (ref 0–200)
HDL: 58.9 mg/dL (ref >39.00)
LDL Cholesterol: 102 mg/dL — ABNORMAL HIGH (ref 0–99)
NonHDL: 117.44
Total CHOL/HDL Ratio: 3
Triglycerides: 75 mg/dL (ref 0.0–149.0)
VLDL: 15 mg/dL (ref 0.0–40.0)

## 2023-01-15 MED ORDER — ATORVASTATIN CALCIUM 10 MG PO TABS: ORAL_TABLET | ORAL | 3 refills | Status: DC

## 2023-01-15 NOTE — Progress Notes (Signed)
Subjective:  Patient ID: Austin Watkins, male    DOB: 12-13-1945  Age: 77 y.o. MRN: [id number]  CC:  Chief Complaint  Patient presents with   Annual Exam    Pt well, fasting, asking if there are any recommended brain health supplements not having issues but wanted to start thinking about brain health     HPI Austin Watkins presents for Annual Exam No health changes since last visit.   Brain health concern as above  discussed mind diet, brain games, exercise, socialization to help protect cognitive function. Denies any memory issues, just wants to be proactive. Walking for exercise, spends time with friends, puzzles and games.   Hyperlipidemia: Lipitor 10 mg daily, no new myalgias.  Lab Results  Component Value Date   CHOL 175 07/30/2022   HDL 59.20 07/30/2022   LDLCALC 96 07/30/2022   TRIG 103.0 07/30/2022   CHOLHDL 3 07/30/2022   Lab Results  Component Value Date   ALT 21 07/30/2022   AST 21 07/30/2022   ALKPHOS 68 07/30/2022   BILITOT 0.7 07/30/2022   Prediabetes: Borderline on A1c in August 2022. No new blurry vision, increased thirst.  Lab Results  Component Value Date   HGBA1C 5.7 07/24/2021   Wt Readings from Last 3 Encounters:  01/15/23 193 lb 6.4 oz (87.7 kg)  07/30/22 192 lb 3.2 oz (87.2 kg)  07/24/21 190 lb 6.4 oz (86.4 kg)       01/15/2023   10:38 AM 07/30/2022    2:01 PM 07/24/2021    9:41 AM 01/16/2021   11:18 AM 07/18/2020   10:47 AM  Depression screen PHQ 2/9  Decreased Interest 0 0 0 0 0  Down, Depressed, Hopeless 0 0 0 0 0  PHQ - 2 Score 0 0 0 0 0  Altered sleeping  0     Tired, decreased energy  0     Change in appetite  0     Feeling bad or failure about yourself   0     Trouble concentrating  0     Moving slowly or fidgety/restless  0     Suicidal thoughts  0     PHQ-9 Score  0       Health Maintenance  Topic Date Due   DTaP/Tdap/Td (2 - Tdap) 12/01/2017   COVID-19 Vaccine (7 - 2023-24 season) 01/31/2023 (Originally 11/28/2022)    Zoster Vaccines- Shingrix (1 of 2) 04/15/2023 (Originally 02/08/1966)   Pneumonia Vaccine 61+ Years old (1 of 1 - PCV) 07/31/2023 (Originally 02/09/2012)   COLONOSCOPY (Pts 45-39yr Insurance coverage will need to be confirmed)  12/10/2027   Hepatitis C Screening  Completed   HPV VACCINES  Aged Out   INFLUENZA VACCINE  Discontinued  Colonoscopy 12/09/2017.  No personal or FH of prostate CA.  Lab Results  Component Value Date   PSA1 3.2 07/18/2020   PSA1 2.3 12/02/2018   PSA1 2.4 11/19/2017   PSA 2.78 07/24/2021   PSA 2.12 11/05/2015   PSA 1.89 09/04/2014    Immunization History  Administered Date(s) Administered   COVID-19, mRNA, vaccine(Comirnaty)12 years and older 10/03/2022   PFIZER Comirnaty(Gray Top)Covid-19 Tri-Sucrose Vaccine 03/20/2021   PFIZER(Purple Top)SARS-COV-2 Vaccination 01/15/2020, 02/07/2020, 09/15/2020   Pfizer Covid-19 Vaccine Bivalent Booster 167yr& up 08/30/2021   Td 12/02/2007  Shingles - declines Pneumonia vaccine - declines.  Covid booster as above - 10/2022.   No results found. Optho yearly - glasses. Appt last July. Cataracts, not needing surgery yet.  Dental: recent visit. No concerns.   Alcohol: none  Tobacco: none.   Exercise:walking 3.1 miles   History Patient Active Problem List   Diagnosis Date Noted   Cellulitis of foot, right 04/25/2014   Closed fracture of fifth metatarsal bone of right foot 04/18/2014   Closed fracture of the right second, third, and fifth proximal phalanges of the foot 04/18/2014   Lipid disorder 03/29/2012   Allergy history unknown 03/08/2012   Bronchospasm 03/08/2012   Past Medical History:  Diagnosis Date   Allergy    Cataract    Hyperlipidemia    Past Surgical History:  Procedure Laterality Date   knee scoped     Allergies  Allergen Reactions   Neosporin [Neomycin-Bacitracin Zn-Polymyx] Other (See Comments)    Triple antibiotic - rash on skin   Prior to Admission medications   Medication Sig  Start Date End Date Taking? Authorizing Provider  aspirin 81 MG tablet Take 81 mg by mouth daily.   Yes [provider]  atorvastatin (LIPITOR) 10 MG tablet TAKE 1 TABLET(10 MG) BY MOUTH DAILY 07/30/22  Yes Wendie Agreste, MD  Multiple Vitamins-Minerals (MULTIVITAMIN ADULT) TABS Take by mouth.   Yes [provider]   Social History   Socioeconomic History   Marital status: Married    Spouse name: Not on file   Number of children: 2   Years of education: Not on file   Highest education level: Not on file  Occupational History   Occupation: Retired  Tobacco Use   Smoking status: Never   Smokeless tobacco: Never  Vaping Use   Vaping Use: Never used  Substance and Sexual Activity   Alcohol use: No   Drug use: No   Sexual activity: Yes  Other Topics Concern   Not on file  Social History Narrative   Married   Social Determinants of Health   Financial Resource Strain: Not on file  Food Insecurity: Not on file  Transportation Needs: Not on file  Physical Activity: Not on file  Stress: Not on file  Social Connections: Not on file  Intimate Partner Violence: Not on file    Review of Systems 13 point review of systems per patient health survey noted.  Negative other than as indicated above or in HPI.    Objective:   Vitals:   01/15/23 1039  BP: 138/76  Pulse: 68  Temp: 97.8 F (36.6 C)  TempSrc: Temporal  SpO2: 100%  Weight: 193 lb 6.4 oz (87.7 kg)  Height: 5' 9"$  (1.753 m)     Physical Exam Vitals reviewed.  Constitutional:      Appearance: He is well-developed.  HENT:     Head: Normocephalic and atraumatic.     Right Ear: External ear normal.     Left Ear: External ear normal.  Eyes:     Conjunctiva/sclera: Conjunctivae normal.     Pupils: Pupils are equal, round, and reactive to light.  Neck:     Thyroid: No thyromegaly.  Cardiovascular:     Rate and Rhythm: Normal rate and regular rhythm.     Heart sounds: Normal heart sounds.   Pulmonary:     Effort: Pulmonary effort is normal. No respiratory distress.     Breath sounds: Normal breath sounds. No wheezing.  Abdominal:     General: There is no distension.     Palpations: Abdomen is soft.     Tenderness: There is no abdominal tenderness.  Musculoskeletal:  General: No tenderness. Normal range of motion.     Cervical back: Normal range of motion and neck supple.  Lymphadenopathy:     Cervical: No cervical adenopathy.  Skin:    General: Skin is warm and dry.  Neurological:     Mental Status: He is alert and oriented to person, place, and time.     Deep Tendon Reflexes: Reflexes are normal and symmetric.  Psychiatric:        Behavior: Behavior normal.        Assessment & Plan:  Austin Watkins is a 77 y.o. male . Annual physical exam - Plan: Hemoglobin A1c, Comprehensive metabolic panel, Lipid panel  - -anticipatory guidance as below in AVS, screening labs above. Health maintenance items as above in HPI discussed/recommended as applicable.   -Preventative techniques discussed for memory, denies any concerns or deficits at this time.  Hyperlipidemia, unspecified hyperlipidemia type - Plan: Comprehensive metabolic panel, Lipid panel, atorvastatin (LIPITOR) 10 MG tablet  -Tolerating Lipitor, check updated labs, continue same dosing  Prediabetes - Plan: Hemoglobin A1c Hyperglycemia - Plan: Hemoglobin A1c  -Check labs, continue to monitor diet/exercise.  Plan adjustment depending on lab results.  Meds ordered this encounter  Medications   atorvastatin (LIPITOR) 10 MG tablet    Sig: TAKE 1 TABLET(10 MG) BY MOUTH DAILY    Dispense:  90 tablet    Refill:  3    Ok to place on hold.   Patient Instructions  To help maintain/protect memory: Stay active - exercise most days per week. Walking is great.  Stay social, time with friends, family.  MIND diet may also be protective.  Continue puzzles, brain games to keep your mind active.  If any change in  memory let me know right away.   Preventive Care 40 Years and Older, Male Preventive care refers to lifestyle choices and visits with your health care provider that can promote health and wellness. Preventive care visits are also called wellness exams. What can I expect for my preventive care visit? Counseling During your preventive care visit, your health care provider may ask about your: Medical history, including: Past medical problems. Family medical history. History of falls. Current health, including: Emotional well-being. Home life and relationship well-being. Sexual activity. Memory and ability to understand (cognition). Lifestyle, including: Alcohol, nicotine or tobacco, and drug use. Access to firearms. Diet, exercise, and sleep habits. Work and work Statistician. Sunscreen use. Safety issues such as seatbelt and bike helmet use. Physical exam Your health care provider will check your: Height and weight. These may be used to calculate your BMI (body mass index). BMI is a measurement that tells if you are at a healthy weight. Waist circumference. This measures the distance around your waistline. This measurement also tells if you are at a healthy weight and may help predict your risk of certain diseases, such as type 2 diabetes and high blood pressure. Heart rate and blood pressure. Body temperature. Skin for abnormal spots. What immunizations do I need?  Vaccines are usually given at various ages, according to a schedule. Your health care provider will recommend vaccines for you based on your age, medical history, and lifestyle or other factors, such as travel or where you work. What tests do I need? Screening Your health care provider may recommend screening tests for certain conditions. This may include: Lipid and cholesterol levels. Diabetes screening. This is done by checking your blood sugar (glucose) after you have not eaten for a while (fasting). Hepatitis C  test. Hepatitis B test. HIV (human immunodeficiency virus) test. STI (sexually transmitted infection) testing, if you are at risk. Lung cancer screening. Colorectal cancer screening. Prostate cancer screening. Abdominal aortic aneurysm (AAA) screening. You may need this if you are a current or former smoker. Talk with your health care provider about your test results, treatment options, and if necessary, the need for more tests. Follow these instructions at home: Eating and drinking  Eat a diet that includes fresh fruits and vegetables, whole grains, lean protein, and low-fat dairy products. Limit your intake of foods with high amounts of sugar, saturated fats, and salt. Take vitamin and mineral supplements as recommended by your health care provider. Do not drink alcohol if your health care provider tells you not to drink. If you drink alcohol: Limit how much you have to 0-2 drinks a day. Know how much alcohol is in your drink. In the [place] one drink equals one 12 oz bottle of beer (355 mL), one 5 oz glass of wine (148 mL), or one 1 oz glass of hard liquor (44 mL). Lifestyle Brush your teeth every morning and night with fluoride toothpaste. Floss one time each day. Exercise for at least 30 minutes 5 or more days each week. Do not use any products that contain nicotine or tobacco. These products include cigarettes, chewing tobacco, and vaping devices, such as e-cigarettes. If you need help quitting, ask your health care provider. Do not use drugs. If you are sexually active, practice safe sex. Use a condom or other form of protection to prevent STIs. Take aspirin only as told by your health care provider. Make sure that you understand how much to take and what form to take. Work with your health care provider to find out whether it is safe and beneficial for you to take aspirin daily. Ask your health care provider if you need to take a cholesterol-lowering medicine (statin). Find healthy  ways to manage stress, such as: Meditation, yoga, or listening to music. Journaling. Talking to a trusted person. Spending time with friends and family. Safety Always wear your seat belt while driving or riding in a vehicle. Do not drive: If you have been drinking alcohol. Do not ride with someone who has been drinking. When you are tired or distracted. While texting. If you have been using any mind-altering substances or drugs. Wear a helmet and other protective equipment during sports activities. If you have firearms in your house, make sure you follow all gun safety procedures. Minimize exposure to UV radiation to reduce your risk of skin cancer. What's next? Visit your health care provider once a year for an annual wellness visit. Ask your health care provider how often you should have your eyes and teeth checked. Stay up to date on all vaccines. This information is not intended to replace advice given to you by your health care provider. Make sure you discuss any questions you have with your health care provider. Document Revised: 05/15/2021 Document Reviewed: 05/15/2021 Elsevier Patient Education  [place],   Merri Ray, MD [place] 01/15/23 11:18 AM

## 2023-01-15 NOTE — Patient Instructions (Addendum)
To help maintain/protect memory: Stay active - exercise most days per week. Walking is great.  Stay social, time with friends, family.  MIND diet may also be protective.  Continue puzzles, brain games to keep your mind active.  If any change in memory let me know right away.   Preventive Care 18 Years and Older, Male Preventive care refers to lifestyle choices and visits with your health care provider that can promote health and wellness. Preventive care visits are also called wellness exams. What can I expect for my preventive care visit? Counseling During your preventive care visit, your health care provider may ask about your: Medical history, including: Past medical problems. Family medical history. History of falls. Current health, including: Emotional well-being. Home life and relationship well-being. Sexual activity. Memory and ability to understand (cognition). Lifestyle, including: Alcohol, nicotine or tobacco, and drug use. Access to firearms. Diet, exercise, and sleep habits. Work and work Statistician. Sunscreen use. Safety issues such as seatbelt and bike helmet use. Physical exam Your health care provider will check your: Height and weight. These may be used to calculate your BMI (body mass index). BMI is a measurement that tells if you are at a healthy weight. Waist circumference. This measures the distance around your waistline. This measurement also tells if you are at a healthy weight and may help predict your risk of certain diseases, such as type 2 diabetes and high blood pressure. Heart rate and blood pressure. Body temperature. Skin for abnormal spots. What immunizations do I need?  Vaccines are usually given at various ages, according to a schedule. Your health care provider will recommend vaccines for you based on your age, medical history, and lifestyle or other factors, such as travel or where you work. What tests do I need? Screening Your health care  provider may recommend screening tests for certain conditions. This may include: Lipid and cholesterol levels. Diabetes screening. This is done by checking your blood sugar (glucose) after you have not eaten for a while (fasting). Hepatitis C test. Hepatitis B test. HIV (human immunodeficiency virus) test. STI (sexually transmitted infection) testing, if you are at risk. Lung cancer screening. Colorectal cancer screening. Prostate cancer screening. Abdominal aortic aneurysm (AAA) screening. You may need this if you are a current or former smoker. Talk with your health care provider about your test results, treatment options, and if necessary, the need for more tests. Follow these instructions at home: Eating and drinking  Eat a diet that includes fresh fruits and vegetables, whole grains, lean protein, and low-fat dairy products. Limit your intake of foods with high amounts of sugar, saturated fats, and salt. Take vitamin and mineral supplements as recommended by your health care provider. Do not drink alcohol if your health care provider tells you not to drink. If you drink alcohol: Limit how much you have to 0-2 drinks a day. Know how much alcohol is in your drink. In the [place] one drink equals one 12 oz bottle of beer (355 mL), one 5 oz glass of wine (148 mL), or one 1 oz glass of hard liquor (44 mL). Lifestyle Brush your teeth every morning and night with fluoride toothpaste. Floss one time each day. Exercise for at least 30 minutes 5 or more days each week. Do not use any products that contain nicotine or tobacco. These products include cigarettes, chewing tobacco, and vaping devices, such as e-cigarettes. If you need help quitting, ask your health care provider. Do not use drugs. If you are sexually  active, practice safe sex. Use a condom or other form of protection to prevent STIs. Take aspirin only as told by your health care provider. Make sure that you understand how much to  take and what form to take. Work with your health care provider to find out whether it is safe and beneficial for you to take aspirin daily. Ask your health care provider if you need to take a cholesterol-lowering medicine (statin). Find healthy ways to manage stress, such as: Meditation, yoga, or listening to music. Journaling. Talking to a trusted person. Spending time with friends and family. Safety Always wear your seat belt while driving or riding in a vehicle. Do not drive: If you have been drinking alcohol. Do not ride with someone who has been drinking. When you are tired or distracted. While texting. If you have been using any mind-altering substances or drugs. Wear a helmet and other protective equipment during sports activities. If you have firearms in your house, make sure you follow all gun safety procedures. Minimize exposure to UV radiation to reduce your risk of skin cancer. What's next? Visit your health care provider once a year for an annual wellness visit. Ask your health care provider how often you should have your eyes and teeth checked. Stay up to date on all vaccines. This information is not intended to replace advice given to you by your health care provider. Make sure you discuss any questions you have with your health care provider. Document Revised: 05/15/2021 Document Reviewed: 05/15/2021 Elsevier Patient Education  [place].

## 2023-02-04 ENCOUNTER — Encounter: Payer: Federal, State, Local not specified - PPO | Admitting: Family Medicine

## 2023-08-11 ENCOUNTER — Ambulatory Visit
Admission: EM | Admit: 2023-08-11 | Discharge: 2023-08-11 | Disposition: A | Discharge status: Home / Self Care | Payer: Federal, State, Local not specified - PPO | Attending: Family Medicine | Admitting: Family Medicine

## 2023-08-11 DIAGNOSIS — U071 COVID-19: Secondary | ICD-10-CM | POA: Diagnosis not present

## 2023-08-11 MED ORDER — PAXLOVID (300/100) 20 X 150 MG & 10 X 100MG PO TBPK: 3.0000 | ORAL_TABLET | Freq: Two times a day (BID) | ORAL | 0 refills | Status: AC

## 2023-08-11 NOTE — Discharge Instructions (Addendum)
Take Paxlovid 2 times a day for 5 days Call for problems Hold atorvastatin while you are on Paxlovid You may take Tylenol or ibuprofen for pain and fever, he may take over-the-counter cough or cold medicines as needed Drink lots of fluids

## 2023-08-11 NOTE — ED Provider Notes (Signed)
[facility]    CSN: [id number] Arrival date & time: 08/11/23  1915      History   Chief Complaint Chief Complaint  Patient presents with   Covid Positive    HPI Austin Watkins is a 77 y.o. male.   HPI  Patient tested positive for COVID today.  He is COVID vaccinated.  He was unable to reach his primary care doctor to discuss medication. I reviewed his medical record and he has had a GFR over 80 for the last 5 years.  No recent believe he would have kidney impairment He is in good health only medications are for hyperlipidemia and a baby aspirin a day  Past Medical History:  Diagnosis Date   Allergy    Cataract    Hyperlipidemia     Patient Active Problem List   Diagnosis Date Noted   Cellulitis of foot, right 04/25/2014   Closed fracture of fifth metatarsal bone of right foot 04/18/2014   Closed fracture of the right second, third, and fifth proximal phalanges of the foot 04/18/2014   Lipid disorder 03/29/2012   Allergy history unknown 03/08/2012   Bronchospasm 03/08/2012    Past Surgical History:  Procedure Laterality Date   knee scoped         Home Medications    Prior to Admission medications   Medication Sig Start Date End Date Taking? Authorizing Provider  nirmatrelvir & ritonavir (PAXLOVID, 300/100,) 20 x 150 MG & 10 x 100MG  TBPK Take 3 tablets by mouth 2 (two) times daily for 5 days. 08/11/23 08/16/23 Yes Eustace Moore, MD  aspirin 81 MG tablet Take 81 mg by mouth daily.    [provider]  atorvastatin (LIPITOR) 10 MG tablet TAKE 1 TABLET(10 MG) BY MOUTH DAILY 01/15/23   Shade Flood, MD  Multiple Vitamins-Minerals (MULTIVITAMIN ADULT) TABS Take by mouth.    [provider]    Family History Family History  Problem Relation Age of Onset   Heart disease Mother    Thyroid disease Mother    Hyperlipidemia Mother    Heart disease Father    Stroke Father    Diabetes Brother     Social History Social History    Tobacco Use   Smoking status: Never   Smokeless tobacco: Never  Vaping Use   Vaping status: Never Used  Substance Use Topics   Alcohol use: No   Drug use: No     Allergies   Neosporin [neomycin-bacitracin zn-polymyx]   Review of Systems Review of Systems  See HPI Physical Exam    Updated Vital Signs BP (!) 170/97   Pulse 88   Temp 99.2 F (37.3 C) (Oral)   Resp 16   SpO2 98%      Physical Exam Constitutional:      General: He is not in acute distress.    Appearance: He is well-developed. He is ill-appearing.  HENT:     Head: Normocephalic and atraumatic.  Eyes:     Conjunctiva/sclera: Conjunctivae normal.     Pupils: Pupils are equal, round, and reactive to light.  Cardiovascular:     Rate and Rhythm: Normal rate.     Heart sounds: Normal heart sounds.  Pulmonary:     Effort: Pulmonary effort is normal. No respiratory distress.     Breath sounds: Normal breath sounds.  Abdominal:     General: There is no distension.     Palpations: Abdomen is soft.  Musculoskeletal:  General: Normal range of motion.     Cervical back: Normal range of motion.  Skin:    General: Skin is warm and dry.  Neurological:     Mental Status: He is alert.      UC Treatments / Results  Labs (all labs ordered are listed, but only abnormal results are displayed) Labs Reviewed - No data to display  EKG   Radiology No results found.  Procedures Procedures (including critical care time)  Medications Ordered in UC Medications - No data to display  Initial Impression / Assessment and Plan / UC Course  I have reviewed the triage vital signs and the nursing notes.  Pertinent labs & imaging results that were available during my care of the patient were reviewed by me and considered in my medical decision making (see chart for details).     Final Clinical Impressions(s) / UC Diagnoses   Final diagnoses:  COVID-19     Discharge Instructions      Take  Paxlovid 2 times a day for 5 days Call for problems Hold atorvastatin while you are on Paxlovid You may take Tylenol or ibuprofen for pain and fever, he may take over-the-counter cough or cold medicines as needed Drink lots of fluids      ED Prescriptions     Medication Sig Dispense Auth. Provider   nirmatrelvir & ritonavir (PAXLOVID, 300/100,) 20 x 150 MG & 10 x 100MG  TBPK Take 3 tablets by mouth 2 (two) times daily for 5 days. 30 tablet Eustace Moore, MD      PDMP not reviewed this encounter.   Eustace Moore, MD 08/11/23 [phone]

## 2023-08-11 NOTE — ED Triage Notes (Signed)
Pt c/o testing pos for covid today at lunch. Says sxs started Sat. Having fatigue and bodyaches. Tylenol and motrin prn.

## 2023-08-12 ENCOUNTER — Telehealth: Payer: Self-pay | Admitting: Family Medicine

## 2023-08-12 ENCOUNTER — Telehealth: Payer: Federal, State, Local not specified - PPO | Admitting: Family Medicine

## 2023-08-12 NOTE — Telephone Encounter (Signed)
Pt reports he was seen at Urgent Care 08/11/2023. Canceled appt.

## 2023-08-12 NOTE — Telephone Encounter (Signed)
Patient Name First: Austin Last: Watkins Gender: Unknown DOB: 03-20-1946 Age: 77 Y 5 M 30 D Return Phone Number: [phone] (Primary) Address: City/ State/ Zip: [place]  [address] Client [facility] Night - C Client Site  [facility] - Night Provider Meredith Staggers- MD Contact Type Call Who Is Calling Patient / Member / Family / Caregiver Call Type Triage / Clinical Relationship To Patient Self Return Phone Number [phone] (Primary) Chief Complaint Flu Symptom Reason for Call Request to Schedule Office Appointment Initial Comment Caller states that he has an appt for tomorrow virtually however he tested positive for Covid. My Chart will not take log in or password, will not let him reset it, he would like to get anything available, would like to make an appt. Translation No Nurse Assessment Nurse: Edson Snowball, RN, Alice Date/Time (Eastern Time): 08/11/2023 5:33:12 PM Confirm and document reason for call. If symptomatic, describe symptoms. ---caller states developed symptoms of runny nose, chills, body aches, shortness of breath, on Saturday. test positive for covid today. had a virtual appt for tomorrow but having technical difficulty logging into the mychart. current symptoms of dry cough, fatigue, and runny nose. Does the patient have any new or worsening symptoms? ---Yes Will a triage be completed? ---Yes Related visit to physician within the last 2 weeks? ---No Does the PT have any chronic conditions? (i.e. diabetes, asthma, this includes High risk factors for pregnancy, etc.) ---No Is this a behavioral health or substance abuse call? ---No Guidelines Guideline Title Affirmed Question Affirmed Notes Nurse Date/Time (Eastern Time) COVID-19 - Diagnosed or Suspected [1] HIGH RISK patient (e.g., weak immune system, age Austin Watkins 08/11/2023 5:39:43 PM PLEASE NOTE: All timestamps contained  within this report are represented as [place] Standard Time. CONFIDENTIALTY NOTICE: This fax transmission is intended only for the addressee. It contains information that is legally privileged, confidential or otherwise protected from use or disclosure. If you are not the intended recipient, you are strictly prohibited from reviewing, disclosing, copying using or disseminating any of this information or taking any action in reliance on or regarding this information. If you have received this fax in error, please notify us immediately by telephone so that we can arrange for its return to [place]. Phone: [phone], Toll-Free: [phone], Fax: [phone] Page: 2 of 2 Call Id: [id number] Guidelines Guideline Title Affirmed Question Affirmed Notes Nurse Date/Time Austin Watkins Time) > 64 years, obesity with BMI 30 or higher, pregnant, chronic lung disease) AND [2] COVID symptoms (e.g., cough, fever) (Exceptions: Already seen by PCP and no new or worsening symptoms.) Disp. Time Austin Watkins Time) Disposition Final User 08/11/2023 5:11:23 PM Attempt made - message left Austin Watkins 08/11/2023 5:46:37 PM Call PCP within 24 Hours Yes Edson Snowball, RN, Alice Final Disposition 08/11/2023 5:46:37 PM Call PCP within 24 Hours Yes Edson Snowball, RN, Jannett Celestine Disagree/Comply Comply Caller Understands Yes PreDisposition Call Doctor Care Advice Given Per Guideline CALL PCP WITHIN 24 HOURS: * IF OFFICE WILL BE OPEN: Call the office when it opens tomorrow morning. * You need to discuss this with your doctor (or NP/PA) within the next 24 hours. * A telemedicine visit can be a good choice for care for people with symptoms of COVID-19. CALL BACK IF: * You become worse CARE ADVICE given per COVID-19 - DIAGNOSED OR SUSPECTED (Adult) guideline. Referrals REFERRED TO PCP OFFICE  Has appt today 08/12/23 MyChart 10:20am

## 2023-08-17 ENCOUNTER — Encounter: Payer: Self-pay | Admitting: Family Medicine

## 2023-08-17 ENCOUNTER — Ambulatory Visit: Payer: Federal, State, Local not specified - PPO | Admitting: Family Medicine

## 2023-08-17 VITALS — BP 140/76 | HR 86 | Temp 97.5°F | Ht 70.0 in | Wt 184.4 lb

## 2023-08-17 DIAGNOSIS — U071 COVID-19: Secondary | ICD-10-CM

## 2023-08-17 MED ORDER — GUAIFENESIN-CODEINE 100-10 MG/5ML PO SYRP: 10.0000 mL | ORAL_SOLUTION | Freq: Three times a day (TID) | ORAL | 0 refills | Status: DC | PRN

## 2023-08-17 NOTE — Progress Notes (Unsigned)
Subjective:    Patient ID: Austin Watkins, male    DOB: 17-Jun-1946, 77 y.o.   MRN: [id number]  HPI COVID- pt developed sxs on 9/7 and went to UC on 9/10 and tested +.  Was treated w/ Paxlovid and finished yesterday.  Pt reports some improvement after 1 day of Paxlovid.  + sweats and chills, fatigue, body aches, post nasal drip, cough.  Currently cough is the most bothersome- particularly at night.  Denies SOB.     Review of Systems For ROS see HPI     Objective:   Physical Exam Vitals reviewed.  Constitutional:      General: He is not in acute distress.    Appearance: Normal appearance. He is not ill-appearing.  HENT:     Head: Normocephalic and atraumatic.     Right Ear: Tympanic membrane and ear canal normal.     Left Ear: Tympanic membrane and ear canal normal.     Nose: Congestion present. No rhinorrhea.     Comments: No TTP over frontal or maxillary sinuses Eyes:     Extraocular Movements: Extraocular movements intact.     Conjunctiva/sclera: Conjunctivae normal.  Cardiovascular:     Rate and Rhythm: Normal rate and regular rhythm.  Pulmonary:     Effort: Pulmonary effort is normal. No respiratory distress.     Breath sounds: No wheezing or rhonchi.     Comments: + wet cough Musculoskeletal:     Cervical back: Neck supple.  Lymphadenopathy:     Cervical: No cervical adenopathy.  Skin:    General: Skin is warm and dry.  Neurological:     General: No focal deficit present.     Mental Status: He is alert and oriented to person, place, and time.  Psychiatric:        Mood and Affect: Mood normal.        Behavior: Behavior normal.        Thought Content: Thought content normal.           Assessment & Plan:   COVID- new.  Pt was dx'd at [facility] and subsequently tx'd w/ Paxlovid.  He finished this over the weekend.  Reports overall feeling better but cough persists.  Will start codeine cough syrup for him to get some rest at night.  Reviewed supportive care and red  flags that should prompt return.  Pt expressed understanding and is in agreement w/ plan.

## 2023-08-17 NOTE — Patient Instructions (Signed)
Follow up as needed or as scheduled USE the codeine cough syrup as needed for cough Drink LOTS of fluids REST! You can get the new vaccine in 4-6 weeks Call with any questions or concerns Hang in there!

## 2023-08-21 ENCOUNTER — Telehealth: Payer: Self-pay | Admitting: Family Medicine

## 2023-08-21 DIAGNOSIS — R051 Acute cough: Secondary | ICD-10-CM

## 2023-08-21 MED ORDER — GUAIFENESIN-CODEINE 100-10 MG/5ML PO SYRP: 10.0000 mL | ORAL_SOLUTION | Freq: Three times a day (TID) | ORAL | 0 refills | Status: DC | PRN

## 2023-08-21 MED ORDER — BENZONATATE 200 MG PO CAPS: 200.0000 mg | ORAL_CAPSULE | Freq: Two times a day (BID) | ORAL | 0 refills | Status: DC | PRN

## 2023-08-21 NOTE — Telephone Encounter (Signed)
Patient agreed to this but notes he only has one dose of the codeine cough syrup left should more be sent in

## 2023-08-21 NOTE — Telephone Encounter (Signed)
He was prescribed codeine cough syrup on 9/16.  He can continue to take this and we will add Tessalon 200mg  TID prn cough (#30, no refills).  If symptoms are not improving by next week, he should schedule a visit to be re-evaluated

## 2023-08-21 NOTE — Telephone Encounter (Signed)
Called and informed the patient.

## 2023-08-21 NOTE — Telephone Encounter (Signed)
More cough syrup was sent to his pharmacy

## 2023-08-21 NOTE — Telephone Encounter (Signed)
Patient saw you 08/17/23 for COVID notes overall doing better but cough seems a bit worse, if there anything else he could have or should he return for another evaluation?

## 2023-08-21 NOTE — Telephone Encounter (Signed)
Caller name: Spouse   On DPR?: Yes  Call back number: [phone]  Provider they see: Shade Flood, MD  Reason for call:   Please use cell phone home phone is not working. Symptoms started Sept 7th - Sept 10 Covid + Sept 16 Cough Covid sym better but cough much worse no temperature advise

## 2023-08-21 NOTE — Addendum Note (Signed)
Addended by: Sheliah Hatch on: 08/21/2023 12:24 PM   Modules accepted: Orders

## 2023-08-27 ENCOUNTER — Ambulatory Visit: Payer: Federal, State, Local not specified - PPO | Admitting: Family Medicine

## 2023-08-27 ENCOUNTER — Ambulatory Visit (INDEPENDENT_AMBULATORY_CARE_PROVIDER_SITE_OTHER)
Admission: RE | Admit: 2023-08-27 | Discharge: 2023-08-27 | Disposition: A | Discharge status: Home / Self Care | Payer: Federal, State, Local not specified - PPO | Source: Ambulatory Visit | Attending: Family Medicine | Admitting: Family Medicine

## 2023-08-27 ENCOUNTER — Encounter: Payer: Self-pay | Admitting: Family Medicine

## 2023-08-27 VITALS — BP 138/82 | HR 87 | Temp 97.9°F | Wt 182.8 lb

## 2023-08-27 DIAGNOSIS — R062 Wheezing: Secondary | ICD-10-CM | POA: Diagnosis not present

## 2023-08-27 DIAGNOSIS — R051 Acute cough: Secondary | ICD-10-CM

## 2023-08-27 DIAGNOSIS — J22 Unspecified acute lower respiratory infection: Secondary | ICD-10-CM

## 2023-08-27 DIAGNOSIS — U071 COVID-19: Secondary | ICD-10-CM | POA: Diagnosis not present

## 2023-08-27 LAB — CBC
HCT: 42.8 % (ref 39.0–52.0)
Hemoglobin: 14.2 g/dL (ref 13.0–17.0)
MCHC: 33.2 g/dL (ref 30.0–36.0)
MCV: 97.9 fl (ref 78.0–100.0)
Platelets: 378 10*3/uL (ref 150.0–400.0)
RBC: 4.37 Mil/uL (ref 4.22–5.81)
RDW: 12.7 % (ref 11.5–15.5)
WBC: 13.9 10*3/uL — ABNORMAL HIGH (ref 4.0–10.5)

## 2023-08-27 MED ORDER — AZITHROMYCIN 250 MG PO TABS: ORAL_TABLET | ORAL | 0 refills | Status: DC

## 2023-08-27 MED ORDER — SPACER/AERO-HOLDING CHAMBERS DEVI: 0 refills | Status: DC

## 2023-08-27 MED ORDER — BENZONATATE 200 MG PO CAPS: 200.0000 mg | ORAL_CAPSULE | Freq: Two times a day (BID) | ORAL | 0 refills | Status: DC | PRN

## 2023-08-27 MED ORDER — GUAIFENESIN-CODEINE 100-10 MG/5ML PO SYRP: 10.0000 mL | ORAL_SOLUTION | Freq: Three times a day (TID) | ORAL | 0 refills | Status: DC | PRN

## 2023-08-27 MED ORDER — ALBUTEROL SULFATE HFA 108 (90 BASE) MCG/ACT IN AERS: 2.0000 | INHALATION_SPRAY | Freq: Four times a day (QID) | RESPIRATORY_TRACT | 0 refills | Status: DC | PRN

## 2023-08-27 NOTE — Progress Notes (Signed)
Subjective:  Patient ID: Austin Watkins, male    DOB: [birth year]  Age: 77 y.o. MRN: [id number]  CC:  Chief Complaint  Patient presents with   Cough    Covid  9/10-9/15, sx started 9/07, cough started before that and has not gotten any better. Deep cough, is sore on lower abdomen more on right side.tried delsym when was given paxlovid, then codeine coug syrup, last week was given the tesselon perles with another round of cough syrup. Is productive with yellow/brown color. Cough is more at night, is not wheezing but feels and hears something when he exhales. No chest pressure or burning     HPI Austin Watkins presents for   Cough: As above.  COVID-19 infection recently, early September. Cough started about 9/7. Dry cough initially.  Chart reviewed, urgent care [facility] visit September 10 with COVID-19 infection, paxlovid Rx- completed on 9/15, Treated with Delsym, no relief of cough.  Visit noted from September 16 with my colleague.  At that visit codeine cough syrup was ordered.  Tessalon Perles 200 mg added on September 20 and codeine refilled. Still with cough. Feels worse. Now sore in abdomen muscles with cough. No n/v/d. Drinking fluids. Urinating normally.  No recent fever.  Some sweats this morning. Chills initial part of illness only.  Cough is productive of yellow-brown sputum - when taking Delsym. Still productive. Min sleep d/t cough. more cough at night, no known wheeze.  Taking codeine cough syrup tid - waking up every 2 hours d/t cough.   Tessalon perles BID - unsure if helping.  Deep cough without improvement. Worse than first week.    History Patient Active Problem List   Diagnosis Date Noted   Cellulitis of foot, right 04/25/2014   Closed fracture of fifth metatarsal bone of right foot 04/18/2014   Closed fracture of the right second, third, and fifth proximal phalanges of the foot 04/18/2014   Lipid disorder 03/29/2012   Allergy history unknown 03/08/2012    Bronchospasm 03/08/2012   Past Medical History:  Diagnosis Date   Allergy    Cataract    Hyperlipidemia    Past Surgical History:  Procedure Laterality Date   knee scoped     Allergies  Allergen Reactions   Neosporin [Neomycin-Bacitracin Zn-Polymyx] Other (See Comments)    Triple antibiotic - rash on skin   Prior to Admission medications   Medication Sig Start Date End Date Taking? Authorizing Provider  aspirin 81 MG tablet Take 81 mg by mouth daily.   Yes [provider]  atorvastatin (LIPITOR) 10 MG tablet TAKE 1 TABLET(10 MG) BY MOUTH DAILY 01/15/23  Yes Shade Flood, MD  benzonatate (TESSALON) 200 MG capsule Take 1 capsule (200 mg total) by mouth 2 (two) times daily as needed for cough. 08/21/23  Yes Sheliah Hatch, MD  guaiFENesin-codeine (ROBITUSSIN AC) 100-10 MG/5ML syrup Take 10 mLs by mouth 3 (three) times daily as needed for cough. 08/21/23  Yes Sheliah Hatch, MD  Multiple Vitamins-Minerals (MULTIVITAMIN ADULT) TABS Take by mouth.   Yes [provider]  Nirmatrelvir-Ritonavir (PAXLOVID PO) Take by mouth.   Yes [provider]   Social History   Socioeconomic History   Marital status: Married    Spouse name: Not on file   Number of children: 2   Years of education: Not on file   Highest education level: Not on file  Occupational History   Occupation: Retired  Tobacco Use   Smoking status: Never  Smokeless tobacco: Never  Vaping Use   Vaping status: Never Used  Substance and Sexual Activity   Alcohol use: No   Drug use: No   Sexual activity: Yes  Other Topics Concern   Not on file  Social History Narrative   Married   Social Determinants of Health   Financial Resource Strain: Not on file  Food Insecurity: Not on file  Transportation Needs: Not on file  Physical Activity: Not on file  Stress: Not on file  Social Connections: Not on file  Intimate Partner Violence: Not on file    Review of  Systems   Objective:   Vitals:   08/27/23 1109 08/27/23 1206  BP: (!) 142/100 138/82  Pulse: 87   Temp: 97.9 F (36.6 C)   TempSrc: Temporal   SpO2: 97%   Weight: 182 lb 12.8 oz (82.9 kg)    Physical Exam Vitals reviewed.  Constitutional:      General: He is not in acute distress.    Appearance: Normal appearance. He is well-developed. He is not toxic-appearing or diaphoretic.  HENT:     Head: Normocephalic and atraumatic.     Right Ear: Tympanic membrane and external ear normal.     Left Ear: Tympanic membrane and external ear normal.     Ears:     Comments: Some cerumen noted in bilateral canals but not impacted.    Nose: No rhinorrhea.     Mouth/Throat:     Mouth: Mucous membranes are moist.     Pharynx: No oropharyngeal exudate or posterior oropharyngeal erythema.  Eyes:     Conjunctiva/sclera: Conjunctivae normal.     Pupils: Pupils are equal, round, and reactive to light.  Cardiovascular:     Rate and Rhythm: Normal rate and regular rhythm.     Heart sounds: Normal heart sounds. No murmur heard. Pulmonary:     Effort: Pulmonary effort is normal.     Breath sounds: Wheezing and rhonchi present. No rales.     Comments: Speaking in full sentences without respiratory distress.  Faint wheeze heard in upper lobes bilaterally, primarily expiratory.  Coarse breath sounds with faint rhonchi right lower lobe greater than left lower lobe. Abdominal:     Palpations: Abdomen is soft.     Tenderness: There is no abdominal tenderness.  Musculoskeletal:     Cervical back: Neck supple.  Lymphadenopathy:     Cervical: No cervical adenopathy.  Skin:    General: Skin is warm and dry.     Findings: No rash.  Neurological:     Mental Status: He is alert and oriented to person, place, and time.  Psychiatric:        Behavior: Behavior normal.     Assessment & Plan:  Austin Watkins is a 77 y.o. male . COVID - Plan: DG Chest 2 View, guaiFENesin-codeine (ROBITUSSIN AC) 100-10  MG/5ML syrup  Acute cough - Plan: CBC, DG Chest 2 View, benzonatate (TESSALON) 200 MG capsule  LRTI (lower respiratory tract infection) - Plan: CBC, azithromycin (ZITHROMAX) 250 MG tablet, DG Chest 2 View  Wheezing - Plan: albuterol (VENTOLIN HFA) 108 (90 Base) MCG/ACT inhaler, Spacer/Aero-Holding Rudean Curt, DG Chest 2 View  Worsening cough since COVID infection, possible secondary bacterial infection, differential includes atypical such as mycoplasma or pertussis with wheeze.  No prior history of asthma.  No edema, no other signs of fluid overload, less likely CHF.  Differential includes pneumonia with rhonchi, will check x-ray and adjust meds accordingly if present.  Start azithromycin for now, albuterol if needed for wheeze with spacer.  Check CBC.  4-day follow-up with urgent care precautions if worsening in the interim.  Tessalon and codeine cough syrup refilled for now.  Understanding of plan expressed.  Meds ordered this encounter  Medications   azithromycin (ZITHROMAX) 250 MG tablet    Sig: Take 2 tablets on day 1, then 1 tablet daily on days 2 through 5    Dispense:  6 tablet    Refill:  0   albuterol (VENTOLIN HFA) 108 (90 Base) MCG/ACT inhaler    Sig: Inhale 2 puffs into the lungs every 6 (six) hours as needed for wheezing or shortness of breath.    Dispense:  8 g    Refill:  0   Spacer/Aero-Holding Chambers DEVI    Sig: Use with albuterol.    Dispense:  1 each    Refill:  0   benzonatate (TESSALON) 200 MG capsule    Sig: Take 1 capsule (200 mg total) by mouth 2 (two) times daily as needed for cough.    Dispense:  20 capsule    Refill:  0   guaiFENesin-codeine (ROBITUSSIN AC) 100-10 MG/5ML syrup    Sig: Take 10 mLs by mouth 3 (three) times daily as needed for cough.    Dispense:  120 mL    Refill:  0   Patient Instructions  Please have x-ray performed at the imaging location below.  Once I review those results we will let you know if we need to change plan further.   For now start azithromycin antibiotic to cover for possible bacterial cause.  Albuterol every 6 hours as needed with the spacer if you are wheezing.  Okay to continue Tessalon and codeine cough syrup for now.  I expect symptoms to be improving in the next few days but recheck with me on Monday.  If any worsening of symptoms over the weekend or you have or wheezing, require albuterol sooner than every 4-6 hours, be seen.  Hang in there.  Return to the clinic or go to the nearest emergency room if any of your symptoms worsen or new symptoms occur.     Signed,   Meredith Staggers, MD [facility], [facility] 08/27/23 12:26 PM

## 2023-08-27 NOTE — Patient Instructions (Signed)
Please have x-ray performed at the imaging location below.  Once I review those results we will let you know if we need to change plan further.  For now start azithromycin antibiotic to cover for possible bacterial cause.  Albuterol every 6 hours as needed with the spacer if you are wheezing.  Okay to continue Tessalon and codeine cough syrup for now.  I expect symptoms to be improving in the next few days but recheck with me on Monday.  If any worsening of symptoms over the weekend or you have or wheezing, require albuterol sooner than every 4-6 hours, be seen.  Hang in there.  Return to the clinic or go to the nearest emergency room if any of your symptoms worsen or new symptoms occur.

## 2023-08-28 ENCOUNTER — Other Ambulatory Visit: Payer: Self-pay | Admitting: Family Medicine

## 2023-08-28 DIAGNOSIS — J189 Pneumonia, unspecified organism: Secondary | ICD-10-CM

## 2023-08-28 MED ORDER — AMOXICILLIN-POT CLAVULANATE 875-125 MG PO TABS: 1.0000 | ORAL_TABLET | Freq: Two times a day (BID) | ORAL | 0 refills | Status: DC

## 2023-08-28 NOTE — Progress Notes (Signed)
See xr results  

## 2023-08-31 ENCOUNTER — Ambulatory Visit: Payer: Federal, State, Local not specified - PPO | Admitting: Family Medicine

## 2023-08-31 ENCOUNTER — Encounter: Payer: Self-pay | Admitting: Family Medicine

## 2023-08-31 VITALS — BP 120/76 | HR 80 | Temp 96.6°F | Ht 70.0 in | Wt 182.8 lb

## 2023-08-31 DIAGNOSIS — R051 Acute cough: Secondary | ICD-10-CM

## 2023-08-31 DIAGNOSIS — J189 Pneumonia, unspecified organism: Secondary | ICD-10-CM

## 2023-08-31 LAB — CBC
HCT: 42 % (ref 39.0–52.0)
Hemoglobin: 13.8 g/dL (ref 13.0–17.0)
MCHC: 32.8 g/dL (ref 30.0–36.0)
MCV: 97.9 fL (ref 78.0–100.0)
Platelets: 356 10*3/uL (ref 150.0–400.0)
RBC: 4.29 Mil/uL (ref 4.22–5.81)
RDW: 12.8 % (ref 11.5–15.5)
WBC: 11.7 10*3/uL — ABNORMAL HIGH (ref 4.0–10.5)

## 2023-08-31 LAB — BASIC METABOLIC PANEL
BUN: 15 mg/dL (ref 6–23)
CO2: 26 meq/L (ref 19–32)
Calcium: 9.6 mg/dL (ref 8.4–10.5)
Chloride: 101 meq/L (ref 96–112)
Creatinine, Ser: 0.83 mg/dL (ref 0.40–1.50)
GFR: 84.99 mL/min (ref >60.00)
Glucose, Bld: 96 mg/dL (ref 70–99)
Potassium: 4.9 meq/L (ref 3.5–5.1)
Sodium: 136 meq/L (ref 135–145)

## 2023-08-31 NOTE — Progress Notes (Signed)
Subjective:  Patient ID: Austin Watkins, male    DOB: 09-Oct-1946  Age: 77 y.o. MRN: [id number]  CC:  Chief Complaint  Patient presents with   Cough    Patient complains of recurrent productive with clear-yellow-brown sputum since September 7th    HPI Austin Watkins presents for   COVID-19 infection, right middle lobe pneumonia: Follow-up from 4 days ago.  Treated for COVID-19 infection but some worsening cough since that infection, thought to have secondary right middle lobe pneumonia.  Some wheezing at that time as well, possible atypicals but given age and chest x-ray findings added Augmentin to azithromycin initially prescribed.  Refilled codeine cough syrup.  Tessalon Perles if needed.  Since last visit, feeling better. Received message Saturday about xray (unable to reach by phone on Friday - saw mychart message). Started Augmentin yesterday. 2 doses yesterday, 1 today. No side effects. Finished Zpak today.  Some sweats this am. No measured fever. No chills.  Cough improved after last visit. Some cough over the weekend - not as intense. Still some cough fits. Using tessalon perles BID. codeine cough syrup regularly.  Some wheezing feeling at times. Albuterol 3 times per day - unsure how helpful that is.   Drinking fluids.    History Patient Active Problem List   Diagnosis Date Noted   Cellulitis of foot, right 04/25/2014   Closed fracture of fifth metatarsal bone of right foot 04/18/2014   Closed fracture of the right second, third, and fifth proximal phalanges of the foot 04/18/2014   Lipid disorder 03/29/2012   Allergy history unknown 03/08/2012   Bronchospasm 03/08/2012   Past Medical History:  Diagnosis Date   Allergy    Cataract    Hyperlipidemia    Past Surgical History:  Procedure Laterality Date   knee scoped     Allergies  Allergen Reactions   Neosporin [Neomycin-Bacitracin Zn-Polymyx] Other (See Comments)    Triple antibiotic - rash on skin   Prior to  Admission medications   Medication Sig Start Date End Date Taking? Authorizing Provider  albuterol (VENTOLIN HFA) 108 (90 Base) MCG/ACT inhaler Inhale 2 puffs into the lungs every 6 (six) hours as needed for wheezing or shortness of breath. 08/27/23  Yes Shade Flood, MD  amoxicillin-clavulanate (AUGMENTIN) 875-125 MG tablet Take 1 tablet by mouth 2 (two) times daily. 08/28/23  Yes Shade Flood, MD  aspirin 81 MG tablet Take 81 mg by mouth daily.   Yes [provider]  atorvastatin (LIPITOR) 10 MG tablet TAKE 1 TABLET(10 MG) BY MOUTH DAILY 01/15/23  Yes Shade Flood, MD  benzonatate (TESSALON) 200 MG capsule Take 1 capsule (200 mg total) by mouth 2 (two) times daily as needed for cough. 08/27/23  Yes Shade Flood, MD  Multiple Vitamins-Minerals (MULTIVITAMIN ADULT) TABS Take by mouth.   Yes [provider]  Nirmatrelvir-Ritonavir (PAXLOVID PO) Take by mouth.   Yes [provider]  Spacer/Aero-Holding Rudean Curt Use with albuterol. 08/27/23  Yes Shade Flood, MD   Social History   Socioeconomic History   Marital status: Married    Spouse name: Not on file   Number of children: 2   Years of education: Not on file   Highest education level: Not on file  Occupational History   Occupation: Retired  Tobacco Use   Smoking status: Never   Smokeless tobacco: Never  Vaping Use   Vaping status: Never Used  Substance and Sexual Activity   Alcohol use:  No   Drug use: No   Sexual activity: Yes  Other Topics Concern   Not on file  Social History Narrative   Married   Social Determinants of Health   Financial Resource Strain: Not on file  Food Insecurity: Not on file  Transportation Needs: Not on file  Physical Activity: Not on file  Stress: Not on file  Social Connections: Not on file  Intimate Partner Violence: Not on file    Review of Systems Per HPI.   Objective:   Vitals:   08/31/23 1156  BP: 120/76  Pulse: 80  Temp: (!)  96.6 F (35.9 C)  TempSrc: Axillary  SpO2: 94%  Weight: 182 lb 12.8 oz (82.9 kg)  Height: 5\' 10"  (1.778 m)   Physical Exam Vitals reviewed.  Constitutional:      General: He is not in acute distress.    Appearance: Normal appearance. He is well-developed. He is not ill-appearing, toxic-appearing or diaphoretic.  HENT:     Head: Normocephalic and atraumatic.  Neck:     Vascular: No carotid bruit or JVD.  Cardiovascular:     Rate and Rhythm: Normal rate and regular rhythm.     Heart sounds: Normal heart sounds. No murmur heard. Pulmonary:     Effort: Pulmonary effort is normal.     Breath sounds: Rhonchi (Right middle lobe, coarse breath sounds, possible faint end expiratory wheeze but overall clear otherwise.  No respiratory distress, speaking full sentences.) present. No rales.  Musculoskeletal:     Right lower leg: No edema.     Left lower leg: No edema.  Skin:    General: Skin is warm and dry.  Neurological:     Mental Status: He is alert and oriented to person, place, and time.  Psychiatric:        Mood and Affect: Mood normal.    Ambulatory pulse ox 92 to 94% on room air.   Assessment & Plan:  Austin Watkins is a 77 y.o. male . Acute cough - Plan: CBC  Pneumonia of right middle lobe due to infectious organism - Plan: CBC, Basic metabolic panel Post COVID infection with right middle lobe pneumonia, likely secondary bacterial infection.  Some slight improvement, still with cough fits likely related to underlying infection.  Unknown if component of bronchospasm.  Has albuterol to use as above.  Check updated CBC, check renal function on BMP.  Maintain hydration. Without definitive wheeze on exam hold on prednisone at this time.  Recently started Augmentin, completed azithromycin.  Recheck in the next 48 to 72 hours, with ER/RTC precautions if any worsening symptoms during that time.  Continue antitussives.  No orders of the defined types were placed in this  encounter.  Patient Instructions  Follow up in 3 days. No change in meds for now. Return to the clinic or go to the nearest emergency room if any of your symptoms worsen or new symptoms occur.     Signed,   Meredith Staggers, MD [facility], [facility] 08/31/23 1:28 PM

## 2023-08-31 NOTE — Patient Instructions (Signed)
Follow up in 3 days. No change in meds for now. Return to the clinic or go to the nearest emergency room if any of your symptoms worsen or new symptoms occur.

## 2023-09-03 ENCOUNTER — Encounter: Payer: Self-pay | Admitting: Family Medicine

## 2023-09-03 ENCOUNTER — Ambulatory Visit: Payer: Federal, State, Local not specified - PPO | Admitting: Family Medicine

## 2023-09-03 VITALS — BP 124/72 | HR 77 | Temp 98.0°F | Wt 181.0 lb

## 2023-09-03 DIAGNOSIS — J189 Pneumonia, unspecified organism: Secondary | ICD-10-CM | POA: Diagnosis not present

## 2023-09-03 NOTE — Progress Notes (Signed)
Subjective:  Patient ID: Austin Watkins, male    DOB: 10-16-1946  Age: 77 y.o. MRN: [id number]  CC:  Chief Complaint  Patient presents with   Medical Management of Chronic Issues    Pneumonia -states he is feeling a little better    HPI Austin Watkins presents for   COVID-19 infection with right middle lobe pneumonia Prior COVID infection then worsening cough, suspected right middle lobe pneumonia on September 26, started on azithromycin and then added Augmentin.  He started Augmentin on the 29th.  Antitussives with codeine cough syrup, Tessalon Perles.  Some wheezing noted initially, improved last visit, continued on albuterol.  Decided against Prednisone at that time. Leukocytosis improved on CBC.  Normal renal function on BMP.  Feeling better, less deep cough, less cough. Wheezing has improved. Has cut back on inhaler use. No fever, no shortness of breath. Slept well past few nights, some difficulty last night - not from cough.  Tessalon perles few times per day. Ran out of codeine cough syrup.   Softer stool, no diarrhea.  Drinking fluids.        History Patient Active Problem List   Diagnosis Date Noted   Cellulitis of foot, right 04/25/2014   Closed fracture of fifth metatarsal bone of right foot 04/18/2014   Closed fracture of the right second, third, and fifth proximal phalanges of the foot 04/18/2014   Lipid disorder 03/29/2012   Allergy history unknown 03/08/2012   Bronchospasm 03/08/2012   Past Medical History:  Diagnosis Date   Allergy    Cataract    Hyperlipidemia    Past Surgical History:  Procedure Laterality Date   knee scoped     Allergies  Allergen Reactions   Neosporin [Neomycin-Bacitracin Zn-Polymyx] Other (See Comments)    Triple antibiotic - rash on skin   Prior to Admission medications   Medication Sig Start Date End Date Taking? Authorizing Provider  albuterol (VENTOLIN HFA) 108 (90 Base) MCG/ACT inhaler Inhale 2 puffs into the lungs every  6 (six) hours as needed for wheezing or shortness of breath. 08/27/23  Yes Shade Flood, MD  amoxicillin-clavulanate (AUGMENTIN) 875-125 MG tablet Take 1 tablet by mouth 2 (two) times daily. 08/28/23  Yes Shade Flood, MD  aspirin 81 MG tablet Take 81 mg by mouth daily.   Yes [provider]  atorvastatin (LIPITOR) 10 MG tablet TAKE 1 TABLET(10 MG) BY MOUTH DAILY 01/15/23  Yes Shade Flood, MD  benzonatate (TESSALON) 200 MG capsule Take 1 capsule (200 mg total) by mouth 2 (two) times daily as needed for cough. 08/27/23  Yes Shade Flood, MD  Multiple Vitamins-Minerals (MULTIVITAMIN ADULT) TABS Take by mouth.   Yes [provider]  Nirmatrelvir-Ritonavir (PAXLOVID PO) Take by mouth.   Yes [provider]  Spacer/Aero-Holding Rudean Curt Use with albuterol. 08/27/23  Yes Shade Flood, MD   Social History   Socioeconomic History   Marital status: Married    Spouse name: Not on file   Number of children: 2   Years of education: Not on file   Highest education level: Not on file  Occupational History   Occupation: Retired  Tobacco Use   Smoking status: Never   Smokeless tobacco: Never  Vaping Use   Vaping status: Never Used  Substance and Sexual Activity   Alcohol use: No   Drug use: No   Sexual activity: Yes  Other Topics Concern   Not on file  Social History Narrative  Married   Social Determinants of Corporate investment banker Strain: Not on file  Food Insecurity: Not on file  Transportation Needs: Not on file  Physical Activity: Not on file  Stress: Not on file  Social Connections: Not on file  Intimate Partner Violence: Not on file    Review of Systems  Per HPI Objective:   Vitals:   09/03/23 0819  BP: 124/72  Pulse: 77  Temp: 98 F (36.7 C)  TempSrc: Temporal  SpO2: 96%  Weight: 181 lb (82.1 kg)    Physical Exam Vitals reviewed.  Constitutional:      Appearance: He is well-developed.  HENT:     Head:  Normocephalic and atraumatic.  Neck:     Vascular: No carotid bruit or JVD.  Cardiovascular:     Rate and Rhythm: Normal rate and regular rhythm.     Heart sounds: Normal heart sounds. No murmur heard. Pulmonary:     Effort: Pulmonary effort is normal.     Breath sounds: Rhonchi (Few rhonchi right middle greater than right lower lobe good airflow, no respiratory distress, no wheeze.) present. No wheezing or rales.  Musculoskeletal:     Right lower leg: No edema.     Left lower leg: No edema.  Skin:    General: Skin is warm and dry.  Neurological:     Mental Status: He is alert and oriented to person, place, and time.  Psychiatric:        Mood and Affect: Mood normal.        Assessment & Plan:  Austin Watkins is a 77 y.o. male . Pneumonia of right middle lobe due to infectious organism  -After previous COVID infection.  Improving, tolerating Augmentin after previous azithromycin.  Wheezes improved, none heard on exam, no recent albuterol but has available if needed.  Antitussive discussed including over-the-counter Mucinex as an option.  Recheck in 1 week, sooner if any new or worsening symptoms.  Plan on repeat imaging in 1 month as long as he continues to improve.  No orders of the defined types were placed in this encounter.  Patient Instructions  Glad you are improving. Mucinex or Mucinex DM over the counter if needed or tessalon perles for residual cough.  Recheck in 1 week, sooner if any new or worsening symptoms.  I expect you to continue to improve.  Will plan on repeat x-ray in about a month.  Let me know if there are questions prior to next visit and hang in there.    Signed,   Meredith Staggers, MD [facility], [facility] 09/03/23 9:07 AM

## 2023-09-03 NOTE — Patient Instructions (Addendum)
Glad you are improving. Mucinex or Mucinex DM over the counter if needed or tessalon perles for residual cough.  Recheck in 1 week, sooner if any new or worsening symptoms.  I expect you to continue to improve.  Will plan on repeat x-ray in about a month.  Let me know if there are questions prior to next visit and hang in there.

## 2023-09-11 ENCOUNTER — Encounter: Payer: Self-pay | Admitting: Family Medicine

## 2023-09-11 ENCOUNTER — Ambulatory Visit: Payer: Federal, State, Local not specified - PPO | Admitting: Family Medicine

## 2023-09-11 VITALS — BP 134/76 | HR 78 | Temp 98.1°F | Ht 70.0 in | Wt 182.0 lb

## 2023-09-11 DIAGNOSIS — J189 Pneumonia, unspecified organism: Secondary | ICD-10-CM

## 2023-09-11 NOTE — Patient Instructions (Signed)
Glad you are feeling better.  Okay to continue Tessalon and Mucinex as needed.  Chest x-ray was ordered, please have that performed in the next 4 weeks.  If any worsening of cough or symptoms not continuing to improve over the next week or 2, I am happy to see you back, but at this point follow-up as needed.  Take care!

## 2023-09-11 NOTE — Progress Notes (Signed)
Subjective:  Patient ID: Austin Watkins, male    DOB: Sep 04, 1946  Age: 77 y.o. MRN: [id number]  CC:  Chief Complaint  Patient presents with   Pneumonia    Pt is doing better today than previously no questions     HPI Austin Watkins presents for   Right middle lobe pneumonia Noted after previous treatment of COVID-19 infection.  Diagnosed on September 26, started on azithromycin and then Augmentin starting September 29.  Antitussives with codeine cough syrup and Tessalon Perles and some initial wheeze treated with albuterol.   Improving on October 3 visit.  Less cough, less deep cough, and wheezing had improved.  Still using Tessalon Perles at that time, Option of Mucinex or Mucinex DM over-the-counter for any residual cough.  Plan for repeat chest x-ray in 1 month, here for follow-up.   Continues to improve.  No recent fevers, denies shortness of breath. Slept through the night past 2 nights. Off abx past few days, able to return to  walking past 2 days without dyspnea or wheeze. Not needing albuterol. Eating, drinking ok. Some nasal congestion at times. Tessalon perles in day and mucinex at night working.    History Patient Active Problem List   Diagnosis Date Noted   Cellulitis of foot, right 04/25/2014   Closed fracture of fifth metatarsal bone of right foot 04/18/2014   Closed fracture of the right second, third, and fifth proximal phalanges of the foot 04/18/2014   Lipid disorder 03/29/2012   Allergy history unknown 03/08/2012   Bronchospasm 03/08/2012   Past Medical History:  Diagnosis Date   Allergy    Cataract    Hyperlipidemia    Past Surgical History:  Procedure Laterality Date   knee scoped     Allergies  Allergen Reactions   Neosporin [Neomycin-Bacitracin Zn-Polymyx] Other (See Comments)    Triple antibiotic - rash on skin   Prior to Admission medications   Medication Sig Start Date End Date Taking? Authorizing Provider  albuterol (VENTOLIN HFA) 108 (90  Base) MCG/ACT inhaler Inhale 2 puffs into the lungs every 6 (six) hours as needed for wheezing or shortness of breath. 08/27/23  Yes Shade Flood, MD  aspirin 81 MG tablet Take 81 mg by mouth daily.   Yes [provider]  atorvastatin (LIPITOR) 10 MG tablet TAKE 1 TABLET(10 MG) BY MOUTH DAILY 01/15/23  Yes Shade Flood, MD  benzonatate (TESSALON) 200 MG capsule Take 1 capsule (200 mg total) by mouth 2 (two) times daily as needed for cough. 08/27/23  Yes Shade Flood, MD  Multiple Vitamins-Minerals (MULTIVITAMIN ADULT) TABS Take by mouth.   Yes [provider]  Nirmatrelvir-Ritonavir (PAXLOVID PO) Take by mouth.   Yes [provider]  Spacer/Aero-Holding Rudean Curt Use with albuterol. 08/27/23  Yes Shade Flood, MD  amoxicillin-clavulanate (AUGMENTIN) 875-125 MG tablet Take 1 tablet by mouth 2 (two) times daily. Patient not taking: Reported on 09/11/2023 08/28/23   Shade Flood, MD   Social History   Socioeconomic History   Marital status: Married    Spouse name: Not on file   Number of children: 2   Years of education: Not on file   Highest education level: Not on file  Occupational History   Occupation: Retired  Tobacco Use   Smoking status: Never   Smokeless tobacco: Never  Vaping Use   Vaping status: Never Used  Substance and Sexual Activity   Alcohol use: No   Drug use: No  Sexual activity: Yes  Other Topics Concern   Not on file  Social History Narrative   Married   Social Determinants of Health   Financial Resource Strain: Not on file  Food Insecurity: Not on file  Transportation Needs: Not on file  Physical Activity: Not on file  Stress: Not on file  Social Connections: Not on file  Intimate Partner Violence: Not on file    Review of Systems   Objective:   Vitals:   09/11/23 0943  BP: 134/76  Pulse: 78  Temp: 98.1 F (36.7 C)  TempSrc: Temporal  SpO2: 98%  Weight: 182 lb (82.6 kg)  Height: 5\' 10"   (1.778 m)     Physical Exam Vitals reviewed.  Constitutional:      Appearance: He is well-developed.  HENT:     Head: Normocephalic and atraumatic.  Neck:     Vascular: No carotid bruit or JVD.  Cardiovascular:     Rate and Rhythm: Normal rate and regular rhythm.     Heart sounds: Normal heart sounds. No murmur heard. Pulmonary:     Effort: Pulmonary effort is normal.     Breath sounds: Normal breath sounds. No rales.  Musculoskeletal:     Right lower leg: No edema.     Left lower leg: No edema.  Skin:    General: Skin is warm and dry.  Neurological:     Mental Status: He is alert and oriented to person, place, and time.  Psychiatric:        Mood and Affect: Mood normal.        Assessment & Plan:  Austin Watkins is a 77 y.o. male . Pneumonia of right middle lobe due to infectious organism - Plan: DG Chest 2 View Clinically improved, lungs clear on exam.  Did not appreciate significant rhonchi in the right middle lobe.  Good airflow without wheeze.  Afebrile, able to walk as above without dyspnea.  Continue symptomatic care with Tessalon, Mucinex, recheck chest x-ray in 4 weeks with RTC precautions if any worsening sooner.  No orders of the defined types were placed in this encounter.  Patient Instructions  Glad you are feeling better.  Okay to continue Tessalon and Mucinex as needed.  Chest x-ray was ordered, please have that performed in the next 4 weeks.  If any worsening of cough or symptoms not continuing to improve over the next week or 2, I am happy to see you back, but at this point follow-up as needed.  Take care!    Signed,   Meredith Staggers, MD [facility], [facility] 09/11/23 10:43 AM

## 2023-10-08 ENCOUNTER — Ambulatory Visit (INDEPENDENT_AMBULATORY_CARE_PROVIDER_SITE_OTHER)
Admission: RE | Admit: 2023-10-08 | Discharge: 2023-10-08 | Disposition: A | Discharge status: Home / Self Care | Payer: Federal, State, Local not specified - PPO | Source: Ambulatory Visit | Attending: Family Medicine | Admitting: Family Medicine

## 2023-10-08 ENCOUNTER — Other Ambulatory Visit (HOSPITAL_BASED_OUTPATIENT_CLINIC_OR_DEPARTMENT_OTHER): Payer: Self-pay

## 2023-10-08 DIAGNOSIS — J189 Pneumonia, unspecified organism: Secondary | ICD-10-CM

## 2023-10-08 MED ORDER — COVID-19 MRNA VAC-TRIS(PFIZER) 30 MCG/0.3ML IM SUSY
0.3000 mL | PREFILLED_SYRINGE | Freq: Once | INTRAMUSCULAR | 0 refills | Status: AC
  Filled 2023-10-08: qty 0.3, 1d supply, fill #0

## 2023-10-16 ENCOUNTER — Other Ambulatory Visit (HOSPITAL_BASED_OUTPATIENT_CLINIC_OR_DEPARTMENT_OTHER): Payer: Self-pay

## 2023-11-05 ENCOUNTER — Ambulatory Visit: Payer: Federal, State, Local not specified - PPO | Admitting: Family Medicine

## 2023-11-05 ENCOUNTER — Ambulatory Visit (INDEPENDENT_AMBULATORY_CARE_PROVIDER_SITE_OTHER)
Admission: RE | Admit: 2023-11-05 | Discharge: 2023-11-05 | Disposition: A | Discharge status: Home / Self Care | Payer: Federal, State, Local not specified - PPO | Source: Ambulatory Visit | Attending: Family Medicine | Admitting: Family Medicine

## 2023-11-05 ENCOUNTER — Other Ambulatory Visit: Payer: Self-pay | Admitting: Family Medicine

## 2023-11-05 ENCOUNTER — Encounter: Payer: Self-pay | Admitting: Family Medicine

## 2023-11-05 VITALS — BP 136/60 | HR 86 | Temp 97.9°F | Ht 70.0 in | Wt 186.8 lb

## 2023-11-05 DIAGNOSIS — R9389 Abnormal findings on diagnostic imaging of other specified body structures: Secondary | ICD-10-CM

## 2023-11-05 DIAGNOSIS — R0981 Nasal congestion: Secondary | ICD-10-CM

## 2023-11-05 DIAGNOSIS — J189 Pneumonia, unspecified organism: Secondary | ICD-10-CM

## 2023-11-05 DIAGNOSIS — R052 Subacute cough: Secondary | ICD-10-CM | POA: Diagnosis not present

## 2023-11-05 DIAGNOSIS — R0982 Postnasal drip: Secondary | ICD-10-CM | POA: Diagnosis not present

## 2023-11-05 MED ORDER — IPRATROPIUM BROMIDE 0.06 % NA SOLN: 1.0000 | Freq: Four times a day (QID) | NASAL | 5 refills | Status: DC

## 2023-11-05 NOTE — Patient Instructions (Addendum)
Congestion and cough may be from allergies, postnasal drip.  Claritin or allegra over the counter are an option.  Try flonase twice per day.  Can add ipratoprium nasal spray if continued congestion.  I do think these approaches will help with your cough, especially if that cough is from your upper airway.  If any persistent cough after these meds, let me know.  If any return of wheezing or need for albuterol, I would also like to know and would consider meeting with pulmonary at that time to decide on further testing.  Please have x-ray performed at the [facility] location below and if any concerns I will let you know.  Lungs sound good today.  Take care!  [facility] or xray: Walk in 8:30-4:30 during weekdays, no appointment needed [address]  [place], [place] [address]  Cough, Adult Coughing is a reflex that clears your throat and airways (respiratory system). It helps heal and protect your lungs. It is normal to cough from time to time. A cough that happens with other symptoms or that lasts a long time may be a sign of a condition that needs treatment. A short-term (acute) cough may only last 2-3 weeks. A long-term (chronic) cough may last 8 or more weeks. Coughing is often caused by: Diseases, such as: An infection of the respiratory system. Asthma or other heart or lung diseases. Gastroesophageal reflux. This is when acid comes back up from the stomach. Breathing in things that irritate your lungs. Allergies. Postnasal drip. This is when mucus runs down the back of your throat. Smoking. Some medicines. Follow these instructions at home: Medicines Take over-the-counter and prescription medicines only as told by your health care provider. Talk with your provider before you take cough medicine (cough suppressants). Eating and drinking Do not drink alcohol. Avoid caffeine. Drink enough fluid to keep your pee (urine) pale yellow. Lifestyle Avoid cigarette smoke. Do not use any  products that contain nicotine or tobacco. These products include cigarettes, chewing tobacco, and vaping devices, such as e-cigarettes. If you need help quitting, ask your provider. Avoid things that make you cough. These may include perfumes, candles, cleaning products, or campfire smoke. General instructions  Watch for any changes to your cough. Tell your provider about them. Always cover your mouth when you cough. If the air is dry in your bedroom or home, use a cool mist vaporizer or humidifier. If your cough is worse at night, try to sleep in a semi-upright position. Rest as needed. Contact a health care provider if: You have new symptoms, or your symptoms get worse. You cough up pus. You have a fever that does not go away or a cough that does not get better after 2-3 weeks. You cannot control your cough with medicine, and you are losing sleep. You have pain that gets worse or is not helped with medicine. You lose weight for no clear reason. You have night sweats. Get help right away if: You cough up blood. You have trouble breathing. Your heart is beating very fast. These symptoms may be an emergency. Get help right away. Call 911. Do not wait to see if the symptoms will go away. Do not drive yourself to the hospital. This information is not intended to replace advice given to you by your health care provider. Make sure you discuss any questions you have with your health care provider. Document Revised: 07/18/2022 Document Reviewed: 07/18/2022 Elsevier Patient Education  2024 [employer].

## 2023-11-05 NOTE — Progress Notes (Signed)
Subjective:  Patient ID: Austin Watkins, male    DOB: 1946-11-05  Age: 77 y.o. MRN: [id number]  CC:  Chief Complaint  Patient presents with   Cough    Had COVID/pneumonia in October, notes a productive cough in the mornings and evening, nothing seems to help  Wants to ensure he is healing well     HPI Austin Watkins presents for   Follow-up cough, pneumonia, COVID-19 infection See prior notes.  Right middle lobe pneumonia noted after previous treatment of COVID-19 infection.  Diagnosed September 26, treated with azithromycin, Augmentin, antitussives with codeine cough syrup and Tessalon Perles and initial wheeze treated with albuterol.  Continued improvement since that time on subsequent office visits.Most recent visit October 11, chest x-ray November 7 with significantly decreased right middle lobe opacity consistent with improving pneumonia.  Residual density remains most consistent with postinfectious scarring or residual inflammation.   Still some intermittent cough - some clear mucus with single cough in am, sometimes again midday, and again at bedtime. No coughing fits, wheezing/dyspnea. Feels like congestion in back of throat. No chest congestion.  Has had some persistent sinus congestion, some pnd at times, throat clearing.  Tx: coricidin BID. Has tried otc antihistamine - up to every 4 hours - taking 1-2 per day - no fatigue.  Feels well otherwise - 1-1.3 mile walk daily. No wheeze, dyspnea, fever. Some possible environmental allergy symptoms.  Flonase 1 spray each night.  Sleeping ok. No nocturnal cough.  No recent need for albuterol inhaler.   History Patient Active Problem List   Diagnosis Date Noted   Cellulitis of foot, right 04/25/2014   Closed fracture of fifth metatarsal bone of right foot 04/18/2014   Closed fracture of the right second, third, and fifth proximal phalanges of the foot 04/18/2014   Lipid disorder 03/29/2012   Allergy history unknown 03/08/2012    Bronchospasm 03/08/2012   Past Medical History:  Diagnosis Date   Allergy    Cataract    Hyperlipidemia    Past Surgical History:  Procedure Laterality Date   knee scoped     Allergies  Allergen Reactions   Neosporin [Neomycin-Bacitracin Zn-Polymyx] Other (See Comments)    Triple antibiotic - rash on skin   Prior to Admission medications   Medication Sig Start Date End Date Taking? Authorizing Provider  albuterol (VENTOLIN HFA) 108 (90 Base) MCG/ACT inhaler Inhale 2 puffs into the lungs every 6 (six) hours as needed for wheezing or shortness of breath. 08/27/23  Yes Shade Flood, MD  aspirin 81 MG tablet Take 81 mg by mouth daily.   Yes [provider]  atorvastatin (LIPITOR) 10 MG tablet TAKE 1 TABLET(10 MG) BY MOUTH DAILY 01/15/23  Yes Shade Flood, MD  Multiple Vitamins-Minerals (MULTIVITAMIN ADULT) TABS Take by mouth.   Yes [provider]  Nirmatrelvir-Ritonavir (PAXLOVID PO) Take by mouth.   Yes [provider]  Spacer/Aero-Holding Rudean Curt Use with albuterol. 08/27/23  Yes Shade Flood, MD  amoxicillin-clavulanate (AUGMENTIN) 875-125 MG tablet Take 1 tablet by mouth 2 (two) times daily. Patient not taking: Reported on 09/11/2023 08/28/23   Shade Flood, MD  benzonatate (TESSALON) 200 MG capsule Take 1 capsule (200 mg total) by mouth 2 (two) times daily as needed for cough. Patient not taking: Reported on 11/05/2023 08/27/23   Shade Flood, MD   Social History   Socioeconomic History   Marital status: Married    Spouse name: Not on file  Number of children: 2   Years of education: Not on file   Highest education level: Not on file  Occupational History   Occupation: Retired  Tobacco Use   Smoking status: Never   Smokeless tobacco: Never  Vaping Use   Vaping status: Never Used  Substance and Sexual Activity   Alcohol use: No   Drug use: No   Sexual activity: Yes  Other Topics Concern   Not on file  Social  History Narrative   Married   Social Determinants of Health   Financial Resource Strain: Not on file  Food Insecurity: Not on file  Transportation Needs: Not on file  Physical Activity: Not on file  Stress: Not on file  Social Connections: Not on file  Intimate Partner Violence: Not on file    Review of Systems  Per HPI Objective:   Vitals:   11/05/23 1115  BP: 136/60  Pulse: 86  Temp: 97.9 F (36.6 C)  TempSrc: Temporal  SpO2: 96%  Weight: 186 lb 12.8 oz (84.7 kg)  Height: 5\' 10"  (1.778 m)     Physical Exam Vitals reviewed.  Constitutional:      Appearance: He is well-developed.  HENT:     Head: Normocephalic and atraumatic.     Right Ear: Tympanic membrane, ear canal and external ear normal.     Left Ear: Tympanic membrane, ear canal and external ear normal.     Ears:     Comments: Excess cerumen bilateral canals but not impacted.    Nose: Congestion present. No rhinorrhea.     Comments: Small amount of clear congestion right side, no active discharge or bleeding.     Mouth/Throat:     Pharynx: No oropharyngeal exudate or posterior oropharyngeal erythema.  Eyes:     Conjunctiva/sclera: Conjunctivae normal.     Pupils: Pupils are equal, round, and reactive to light.  Cardiovascular:     Rate and Rhythm: Normal rate and regular rhythm.     Heart sounds: Normal heart sounds. No murmur heard. Pulmonary:     Effort: Pulmonary effort is normal.     Breath sounds: Normal breath sounds. No wheezing, rhonchi or rales.  Abdominal:     Palpations: Abdomen is soft.     Tenderness: There is no abdominal tenderness.  Musculoskeletal:     Cervical back: Neck supple.  Lymphadenopathy:     Cervical: No cervical adenopathy.  Skin:    General: Skin is warm and dry.     Findings: No rash.  Neurological:     Mental Status: He is alert and oriented to person, place, and time.  Psychiatric:        Behavior: Behavior normal.     Assessment & Plan:  Austin Watkins is  a 77 y.o. male . Pneumonia of right middle lobe due to infectious organism - Plan: DG Chest 2 View  PND (post-nasal drip) - Plan: ipratropium (ATROVENT) 0.06 % nasal spray  Sinus congestion - Plan: ipratropium (ATROVENT) 0.06 % nasal spray  Subacute cough - Plan: ipratropium (ATROVENT) 0.06 % nasal spray  Prior right middle lobe pneumonia, lungs clear on exam.  Suspected postnasal drip, upper airway cough with likely allergies and congestion as above.  Increase Flonase to twice daily dosing, option of over-the-counter nonsedating antihistamine, and Atrovent nasal spray if needed.  RTC precautions.  Chest x-ray obtained with stable right middle lobe abnormality, recommendation for CT to rule out nodule, CT ordered.  If return of wheeze or any persistent  symptoms would recommend pulmonary eval  Meds ordered this encounter  Medications   ipratropium (ATROVENT) 0.06 % nasal spray    Sig: Place 1-2 sprays into both nostrils 4 (four) times daily. As needed for nasal congestion    Dispense:  15 mL    Refill:  5   Patient Instructions  Congestion and cough may be from allergies, postnasal drip.  Claritin or allegra over the counter are an option.  Try flonase twice per day.  Can add ipratoprium nasal spray if continued congestion.  I do think these approaches will help with your cough, especially if that cough is from your upper airway.  If any persistent cough after these meds, let me know.  If any return of wheezing or need for albuterol, I would also like to know and would consider meeting with pulmonary at that time to decide on further testing.  Please have x-ray performed at the [facility] location below and if any concerns I will let you know.  Lungs sound good today.  Take care!  [facility] or xray: Walk in 8:30-4:30 during weekdays, no appointment needed [address]  [place], [place] [address]  Cough, Adult Coughing is a reflex that clears your throat and airways (respiratory  system). It helps heal and protect your lungs. It is normal to cough from time to time. A cough that happens with other symptoms or that lasts a long time may be a sign of a condition that needs treatment. A short-term (acute) cough may only last 2-3 weeks. A long-term (chronic) cough may last 8 or more weeks. Coughing is often caused by: Diseases, such as: An infection of the respiratory system. Asthma or other heart or lung diseases. Gastroesophageal reflux. This is when acid comes back up from the stomach. Breathing in things that irritate your lungs. Allergies. Postnasal drip. This is when mucus runs down the back of your throat. Smoking. Some medicines. Follow these instructions at home: Medicines Take over-the-counter and prescription medicines only as told by your health care provider. Talk with your provider before you take cough medicine (cough suppressants). Eating and drinking Do not drink alcohol. Avoid caffeine. Drink enough fluid to keep your pee (urine) pale yellow. Lifestyle Avoid cigarette smoke. Do not use any products that contain nicotine or tobacco. These products include cigarettes, chewing tobacco, and vaping devices, such as e-cigarettes. If you need help quitting, ask your provider. Avoid things that make you cough. These may include perfumes, candles, cleaning products, or campfire smoke. General instructions  Watch for any changes to your cough. Tell your provider about them. Always cover your mouth when you cough. If the air is dry in your bedroom or home, use a cool mist vaporizer or humidifier. If your cough is worse at night, try to sleep in a semi-upright position. Rest as needed. Contact a health care provider if: You have new symptoms, or your symptoms get worse. You cough up pus. You have a fever that does not go away or a cough that does not get better after 2-3 weeks. You cannot control your cough with medicine, and you are losing sleep. You  have pain that gets worse or is not helped with medicine. You lose weight for no clear reason. You have night sweats. Get help right away if: You cough up blood. You have trouble breathing. Your heart is beating very fast. These symptoms may be an emergency. Get help right away. Call 911. Do not wait to see if  the symptoms will go away. Do not drive yourself to the hospital. This information is not intended to replace advice given to you by your health care provider. Make sure you discuss any questions you have with your health care provider. Document Revised: 07/18/2022 Document Reviewed: 07/18/2022 Elsevier Patient Education  2024 [employer]        Signed,   Meredith Staggers, MD [facility], [facility] 11/05/23 11:56 AM

## 2023-11-05 NOTE — Progress Notes (Signed)
See chest x-ray, CT chest recommended to evaluate right middle lobe area.  CT ordered.

## 2023-11-10 ENCOUNTER — Inpatient Hospital Stay
Admission: RE | Admit: 2023-11-10 | Discharge: 2023-11-10 | Discharge status: Home / Self Care | Payer: Federal, State, Local not specified - PPO | Source: Ambulatory Visit | Attending: Family Medicine | Admitting: Family Medicine

## 2023-11-10 DIAGNOSIS — J189 Pneumonia, unspecified organism: Secondary | ICD-10-CM

## 2023-11-10 DIAGNOSIS — R9389 Abnormal findings on diagnostic imaging of other specified body structures: Secondary | ICD-10-CM

## 2023-11-17 ENCOUNTER — Other Ambulatory Visit: Payer: Self-pay | Admitting: Family Medicine

## 2023-11-17 ENCOUNTER — Telehealth: Payer: Self-pay | Admitting: Family Medicine

## 2023-11-17 DIAGNOSIS — J189 Pneumonia, unspecified organism: Secondary | ICD-10-CM

## 2023-11-17 MED ORDER — AZITHROMYCIN 250 MG PO TABS: ORAL_TABLET | ORAL | 0 refills | Status: AC

## 2023-11-17 NOTE — Telephone Encounter (Signed)
Caller name: BRAYLIN DEWATER  On DPR?: Yes  Call back number: [phone] (home)  Provider they see: Shade Flood, MD  Reason for call:   Pt called stating Neva Seat can call him back he was out to breakfast. It was about CT scan.

## 2023-11-17 NOTE — Telephone Encounter (Signed)
Patient is available for call any time now

## 2023-11-17 NOTE — Progress Notes (Signed)
See CT results.  Slight residual cough, some postnasal drip, improved with treatment.  Occasional discolored phlegm.  Discussed CT results, will provide additional course of azithromycin, repeat chest x-ray in 4 to 6 weeks.  Has follow-up in February, can recheck lipids at that time (discussed coronary, aortic calcifications).  RTC precautions given if cough does not continue to improve.  All questions answered.

## 2023-11-17 NOTE — Telephone Encounter (Signed)
Called with results - see results note.

## 2023-12-24 ENCOUNTER — Ambulatory Visit (INDEPENDENT_AMBULATORY_CARE_PROVIDER_SITE_OTHER)
Admission: RE | Admit: 2023-12-24 | Discharge: 2023-12-24 | Disposition: A | Discharge status: Home / Self Care | Payer: Federal, State, Local not specified - PPO | Source: Ambulatory Visit | Attending: Family Medicine | Admitting: Family Medicine

## 2023-12-24 DIAGNOSIS — J189 Pneumonia, unspecified organism: Secondary | ICD-10-CM

## 2023-12-27 ENCOUNTER — Encounter: Payer: Self-pay | Admitting: Family Medicine

## 2024-01-13 ENCOUNTER — Encounter: Payer: Self-pay | Admitting: Pulmonary Disease

## 2024-01-13 ENCOUNTER — Ambulatory Visit: Payer: Federal, State, Local not specified - PPO | Admitting: Pulmonary Disease

## 2024-01-13 VITALS — BP 138/82 | HR 75 | Temp 98.4°F | Ht 70.0 in | Wt 190.0 lb

## 2024-01-13 DIAGNOSIS — R911 Solitary pulmonary nodule: Secondary | ICD-10-CM | POA: Diagnosis not present

## 2024-01-13 DIAGNOSIS — R9389 Abnormal findings on diagnostic imaging of other specified body structures: Secondary | ICD-10-CM | POA: Diagnosis not present

## 2024-01-13 NOTE — Progress Notes (Signed)
Austin Watkins    [id number]    [birth year]  Primary Care Physician:Greene, Asencion Partridge, MD  Referring Physician: Shade Flood, MD [address] [place],  [place] [address]  Chief complaint:   Patient being seen for abnormal CT scan/x-ray  HPI:  Patient had a pneumonia in September treated with a course of antibiotics Had a chest x-ray a follow-up chest x-ray did show improvement in findings Did have a CT scan in December showing some atelectasis in the area of recent pneumonia  Not very symptomatic at present  No chronic cough, no weight loss, no night sweats  Recovered fully from a pneumonia  Denies any chest pains or chest discomfort  Never smoker  No pertinent occupational history May have done some woodworking when he was about 16-17, exposure to sawdust    Outpatient Encounter Medications as of 01/13/2024  Medication Sig   aspirin 81 MG tablet Take 81 mg by mouth daily.   atorvastatin (LIPITOR) 10 MG tablet TAKE 1 TABLET(10 MG) BY MOUTH DAILY   calcium carbonate (TUMS - DOSED IN MG ELEMENTAL CALCIUM) 500 MG chewable tablet Chew 1 tablet by mouth daily.   Multiple Vitamins-Minerals (MULTIVITAMIN ADULT) TABS Take by mouth.   albuterol (VENTOLIN HFA) 108 (90 Base) MCG/ACT inhaler Inhale 2 puffs into the lungs every 6 (six) hours as needed for wheezing or shortness of breath. (Patient not taking: Reported on 01/13/2024)   amoxicillin-clavulanate (AUGMENTIN) 875-125 MG tablet Take 1 tablet by mouth 2 (two) times daily. (Patient not taking: Reported on 01/13/2024)   benzonatate (TESSALON) 200 MG capsule Take 1 capsule (200 mg total) by mouth 2 (two) times daily as needed for cough. (Patient not taking: Reported on 01/13/2024)   ipratropium (ATROVENT) 0.06 % nasal spray Place 1-2 sprays into both nostrils 4 (four) times daily. As needed for nasal congestion (Patient not taking: Reported on 01/13/2024)   Nirmatrelvir-Ritonavir (PAXLOVID PO) Take by mouth. (Patient  not taking: Reported on 01/13/2024)   Spacer/Aero-Holding Rudean Curt Use with albuterol. (Patient not taking: Reported on 01/13/2024)   No facility-administered encounter medications on file as of 01/13/2024.    Allergies as of 01/13/2024 - Review Complete 01/13/2024  Allergen Reaction Noted   Neosporin [neomycin-bacitracin zn-polymyx] Other (See Comments) 03/28/2013    Past Medical History:  Diagnosis Date   Allergy    Cataract    Hyperlipidemia     Past Surgical History:  Procedure Laterality Date   knee scoped      Family History  Problem Relation Age of Onset   Heart disease Mother    Thyroid disease Mother    Hyperlipidemia Mother    Heart disease Father    Stroke Father    Diabetes Brother     Social History   Socioeconomic History   Marital status: Married    Spouse name: Not on file   Number of children: 2   Years of education: Not on file   Highest education level: Not on file  Occupational History   Occupation: Retired  Tobacco Use   Smoking status: Never   Smokeless tobacco: Never  Vaping Use   Vaping status: Never Used  Substance and Sexual Activity   Alcohol use: No   Drug use: No   Sexual activity: Yes  Other Topics Concern   Not on file  Social History Narrative   Married   Social Drivers of Corporate investment banker Strain: Not on file  Food Insecurity: Not on file  Transportation Needs: Not on file  Physical Activity: Not on file  Stress: Not on file  Social Connections: Not on file  Intimate Partner Violence: Not on file    Review of Systems  Respiratory:  Negative for cough and shortness of breath.     Vitals:   01/13/24 1444  BP: 138/82  Pulse: 75  Temp: 98.4 F (36.9 C)  SpO2: 99%     Physical Exam Constitutional:      Appearance: Normal appearance.  HENT:     Head: Normocephalic.     Mouth/Throat:     Mouth: Mucous membranes are moist.  Eyes:     General: No scleral icterus. Cardiovascular:     Rate  and Rhythm: Regular rhythm.     Heart sounds: No murmur heard.    No friction rub.  Pulmonary:     Effort: No respiratory distress.     Breath sounds: No stridor. No wheezing or rhonchi.  Musculoskeletal:     Cervical back: No rigidity or tenderness.  Neurological:     Mental Status: He is alert.  Psychiatric:        Mood and Affect: Mood normal.     Data Reviewed: Chest x-ray is reviewed  CT scan of the chest from December reviewed with the patient  Assessment:  Recent pneumonia  Abnormal chest x-ray, abnormal CT scan of the chest with atelectasis  This likely reveals evolving pneumonia  He is not symptomatic at present, risk profile for neoplastic process is low  Plan/Recommendations:  Will repeat the CT 3 months from the last 1 for evaluation of findings May have a scarring process in the area of the previous pneumonia  Has no ongoing significant symptoms of concern  Follow-up after the repeat CT  Encouraged to call with significant  Encouraged to stay active  Virl Diamond MD [facility] 01/13/2024, 3:24 PM  CC: Shade Flood, MD

## 2024-01-13 NOTE — Patient Instructions (Signed)
CT scan of the chest without contrast to be done first week of March  This is to follow-up on the scarring following the pneumonia  Call us with significant concerns  We will see you soon after the CAT scan is done by middle of March  Continue with regular exercise

## 2024-01-20 ENCOUNTER — Encounter: Payer: Federal, State, Local not specified - PPO | Admitting: Family Medicine

## 2024-01-20 ENCOUNTER — Other Ambulatory Visit: Payer: Self-pay

## 2024-01-20 ENCOUNTER — Other Ambulatory Visit: Payer: Self-pay | Admitting: Family Medicine

## 2024-01-20 DIAGNOSIS — E785 Hyperlipidemia, unspecified: Secondary | ICD-10-CM

## 2024-01-20 MED ORDER — ATORVASTATIN CALCIUM 10 MG PO TABS: ORAL_TABLET | ORAL | 3 refills | Status: DC

## 2024-01-20 NOTE — Telephone Encounter (Signed)
Copied from CRM [id number]. Topic: Clinical - Medication Refill >> Jan 20, 2024  8:07 AM Almira Coaster wrote: Most Recent Primary Care Visit:  Provider: Meredith Staggers R  Department: [facility]  Visit Type: OFFICE VISIT  Date: 11/05/2023  Medication: atorvastatin (LIPITOR) 10 MG tablet  Has the patient contacted their pharmacy? No, patient was scheduled for physical today 01/20/2024; however, appt was canceled. Scheduled for March 24th.  (Agent: If no, request that the patient contact the pharmacy for the refill. If patient does not wish to contact the pharmacy document the reason why and proceed with request.) (Agent: If yes, when and what did the pharmacy advise?)  Is this the correct pharmacy for this prescription? Yes If no, delete pharmacy and type the correct one.  This is the patient's preferred pharmacy:  [id number] - [place], [place] - [address] AT [facility] [address] [place] [address] Phone: [phone] Fax: [phone]   Has the prescription been filled recently? No  Is the patient out of the medication? No, but patient will be out prior to his next appointment, is asking for a refill to hold him over until March 24th.   Has the patient been seen for an appointment in the last year OR does the patient have an upcoming appointment? Yes  Can we respond through MyChart? Yes  Agent: Please be advised that Rx refills may take up to 3 business days. We ask that you follow-up with your pharmacy.

## 2024-02-09 ENCOUNTER — Ambulatory Visit
Admission: RE | Admit: 2024-02-09 | Discharge: 2024-02-09 | Disposition: A | Discharge status: Home / Self Care | Payer: Federal, State, Local not specified - PPO | Source: Ambulatory Visit | Attending: Pulmonary Disease | Admitting: Pulmonary Disease

## 2024-02-09 DIAGNOSIS — R9389 Abnormal findings on diagnostic imaging of other specified body structures: Secondary | ICD-10-CM

## 2024-02-09 DIAGNOSIS — R911 Solitary pulmonary nodule: Secondary | ICD-10-CM

## 2024-02-22 ENCOUNTER — Encounter: Payer: Self-pay | Admitting: Family Medicine

## 2024-02-22 ENCOUNTER — Ambulatory Visit: Payer: Federal, State, Local not specified - PPO | Admitting: Family Medicine

## 2024-02-22 VITALS — BP 130/74 | HR 76 | Temp 98.0°F | Ht 69.25 in | Wt 186.4 lb

## 2024-02-22 DIAGNOSIS — R7303 Prediabetes: Secondary | ICD-10-CM | POA: Diagnosis not present

## 2024-02-22 DIAGNOSIS — Z23 Encounter for immunization: Secondary | ICD-10-CM | POA: Diagnosis not present

## 2024-02-22 DIAGNOSIS — E785 Hyperlipidemia, unspecified: Secondary | ICD-10-CM

## 2024-02-22 DIAGNOSIS — Z0000 Encounter for general adult medical examination without abnormal findings: Secondary | ICD-10-CM

## 2024-02-22 MED ORDER — ATORVASTATIN CALCIUM 20 MG PO TABS: 20.0000 mg | ORAL_TABLET | Freq: Every day | ORAL | 1 refills | Status: DC

## 2024-02-22 NOTE — Progress Notes (Signed)
 Subjective:  Patient ID: Austin Watkins, male    DOB: Aug 02, 1946  Age: 78 y.o. MRN: [id number]  CC:  Chief Complaint  Patient presents with   Annual Exam    Pt is doing well no concerns, pt is fasting     HPI [name] presents for Annual Exam  PCP, [facility], Dr.Olalare, visit February 12, prior pneumonia in September, with CT indicating atelectasis in area of recent pneumonia.  Repeat CT in March, multiple small pulmonary nodules, no significant right solid pulmonary nodule in the upper lobe measuring 2 mm.  Per guidelines no routine follow-up imaging is recommended.   Prediabetes: Borderline A1c on prior labs - diet/exercise approach. Exercise - walking 1 hr 7 days per week. Marland Kitchen Avoiding sodas, sweet tea.  Lab Results  Component Value Date   HGBA1C 5.8 01/15/2023   Wt Readings from Last 3 Encounters:  02/22/24 186 lb 6.4 oz (84.6 kg)  01/13/24 190 lb (86.2 kg)  11/05/23 186 lb 12.8 oz (84.7 kg)    Hyperlipidemia: Lipitor 10 mg daily.  No new myalgias or side effects with meds. Coronary artery calcifications seen on recent CT. No CP/DOE.  On ASA every day - no bleeding.   Lab Results  Component Value Date   CHOL 176 01/15/2023   HDL 58.90 01/15/2023   LDLCALC 102 (H) 01/15/2023   TRIG 75.0 01/15/2023   CHOLHDL 3 01/15/2023   Lab Results  Component Value Date   ALT 19 01/15/2023   AST 24 01/15/2023   ALKPHOS 69 01/15/2023   BILITOT 0.8 01/15/2023         02/22/2024    3:46 PM 11/05/2023   11:14 AM 08/17/2023    3:23 PM 01/15/2023   10:38 AM 07/30/2022    2:01 PM  Depression screen PHQ 2/9  Decreased Interest 0 1 1 0 0  Down, Depressed, Hopeless 0 0 0 0 0  PHQ - 2 Score 0 1 1 0 0  Altered sleeping 0 1 2  0  Tired, decreased energy 0 1 2  0  Change in appetite 0 1 2  0  Feeling bad or failure about yourself  0 0 0  0  Trouble concentrating 0 0 0  0  Moving slowly or fidgety/restless 0 0 0  0  Suicidal thoughts 0 0 0  0  PHQ-9 Score 0 4 7  0   Difficult doing work/chores   Not difficult at all      Health Maintenance  Topic Date Due   Zoster Vaccines- Shingrix (1 of 2) Never done   Pneumonia Vaccine 76+ Years old (1 of 1 - PCV) Never done   DTaP/Tdap/Td (2 - Tdap) 12/01/2017   COVID-19 Vaccine (8 - 2024-25 season) 04/06/2024   Hepatitis C Screening  Completed   HPV VACCINES  Aged Out   INFLUENZA VACCINE  Discontinued   Colonoscopy  Discontinued  Colonoscopy 2019 - no further exams needed.   Immunization History  Administered Date(s) Administered   PFIZER Comirnaty(Gray Top)Covid-19 Tri-Sucrose Vaccine 03/20/2021   PFIZER(Purple Top)SARS-COV-2 Vaccination 01/15/2020, 02/07/2020, 09/15/2020   Pfizer Covid-19 Vaccine Bivalent Booster 74yrs & up 08/30/2021   Pfizer(Comirnaty)Fall Seasonal Vaccine 12 years and older 10/03/2022, 10/08/2023   Td 12/02/2007  Prevnar recommended today.  Declined shingles vaccine. Prior mild shingles outbreak years ago.   No results found.optho earlier this month, no changes, cataracts.   Dental:appt in February.   Alcohol:none  Tobacco: none.   Exercise:as above.  Fall in January, tripped on step  no residual injury or pain  History Patient Active Problem List   Diagnosis Date Noted   Cellulitis of foot, right 04/25/2014   Closed fracture of fifth metatarsal bone of right foot 04/18/2014   Closed fracture of the right second, third, and fifth proximal phalanges of the foot 04/18/2014   Lipid disorder 03/29/2012   Allergy history unknown 03/08/2012   Bronchospasm 03/08/2012   Past Medical History:  Diagnosis Date   Allergy    Cataract    Hyperlipidemia    Past Surgical History:  Procedure Laterality Date   knee scoped     Allergies  Allergen Reactions   Neosporin [Neomycin-Bacitracin Zn-Polymyx] Other (See Comments)    Triple antibiotic - rash on skin   Prior to Admission medications   Medication Sig Start Date End Date Taking? Authorizing Provider  aspirin 81 MG  tablet Take 81 mg by mouth daily.   Yes [provider]  atorvastatin (LIPITOR) 10 MG tablet TAKE 1 TABLET(10 MG) BY MOUTH DAILY 01/20/24  Yes Shade Flood, MD  calcium carbonate (TUMS - DOSED IN MG ELEMENTAL CALCIUM) 500 MG chewable tablet Chew 1 tablet by mouth daily.   Yes [provider]  Multiple Vitamins-Minerals (MULTIVITAMIN ADULT) TABS Take by mouth.   Yes [provider]   Social History   Socioeconomic History   Marital status: Married    Spouse name: Not on file   Number of children: 2   Years of education: Not on file   Highest education level: Master's degree (e.g., MA, MS, MEng, MEd, MSW, MBA)  Occupational History   Occupation: Retired  Tobacco Use   Smoking status: Never   Smokeless tobacco: Never  Vaping Use   Vaping status: Never Used  Substance and Sexual Activity   Alcohol use: No   Drug use: No   Sexual activity: Yes  Other Topics Concern   Not on file  Social History Narrative   Married   Social Drivers of Health   Financial Resource Strain: Low Risk  (02/19/2024)   Overall Financial Resource Strain (CARDIA)    Difficulty of Paying Living Expenses: Not hard at all  Food Insecurity: No Food Insecurity (02/19/2024)   Hunger Vital Sign    Worried About Running Out of Food in the Last Year: Never true    Ran Out of Food in the Last Year: Never true  Transportation Needs: No Transportation Needs (02/19/2024)   PRAPARE - Administrator, Civil Service (Medical): No    Lack of Transportation (Non-Medical): No  Physical Activity: Sufficiently Active (02/19/2024)   Exercise Vital Sign    Days of Exercise per Week: 7 days    Minutes of Exercise per Session: 60 min  Stress: No Stress Concern Present (02/19/2024)   [employer] of Occupational Health - Occupational Stress Questionnaire    Feeling of Stress : Not at all  Social Connections: Moderately Integrated (02/19/2024)   Social Connection and Isolation Panel  [NHANES]    Frequency of Communication with Friends and Family: Three times a week    Frequency of Social Gatherings with Friends and Family: Once a week    Attends Religious Services: More than 4 times per year    Active Member of [employer] or Organizations: No    Attends Engineer, structural: Not on file    Marital Status: Married  Catering manager Violence: Not on file    Review of Systems 13 point  review of systems per patient health survey noted.  Negative other than as indicated above or in HPI.    Objective:   Vitals:   02/22/24 1547  BP: 130/74  Pulse: 76  Temp: 98 F (36.7 C)  TempSrc: Temporal  SpO2: 97%  Weight: 186 lb 6.4 oz (84.6 kg)  Height: 5' 9.25" (1.759 m)     Physical Exam Vitals reviewed.  Constitutional:      Appearance: He is well-developed.  HENT:     Head: Normocephalic and atraumatic.     Right Ear: External ear normal.     Left Ear: External ear normal.     Ears:     Comments: Moderate nonobstructive cerumen bilat canals.  Eyes:     Conjunctiva/sclera: Conjunctivae normal.     Pupils: Pupils are equal, round, and reactive to light.  Neck:     Thyroid: No thyromegaly.  Cardiovascular:     Rate and Rhythm: Normal rate and regular rhythm.     Heart sounds: Normal heart sounds.  Pulmonary:     Effort: Pulmonary effort is normal. No respiratory distress.     Breath sounds: Normal breath sounds. No wheezing.  Abdominal:     General: There is no distension.     Palpations: Abdomen is soft.     Tenderness: There is no abdominal tenderness.  Musculoskeletal:        General: No tenderness. Normal range of motion.     Cervical back: Normal range of motion and neck supple.  Lymphadenopathy:     Cervical: No cervical adenopathy.  Skin:    General: Skin is warm and dry.  Neurological:     Mental Status: He is alert and oriented to person, place, and time.     Deep Tendon Reflexes: Reflexes are normal and symmetric.  Psychiatric:         Behavior: Behavior normal.        Assessment & Plan:  ALEXANDRA POSADAS is a 78 y.o. male . Annual physical exam - Plan: Hemoglobin A1c, Comprehensive metabolic panel, Lipid panel, CANCELED: PSA, Medicare  - -anticipatory guidance as below in AVS, screening labs above. Health maintenance items as above in HPI discussed/recommended as applicable.   Hyperlipidemia, unspecified hyperlipidemia type - Plan: Lipid panel, atorvastatin (LIPITOR) 20 MG tablet, Comprehensive metabolic panel, Lipid panel  -Check updated lipids but given coronary calcium noted on her imaging and prior LDL above 70, will likely increase Lipitor to 20 mg daily.  He will wait to fill new prescription until results posted.  Potential side effects and RTC precautions discussed on higher dose of statin.  Prediabetes - Plan: Hemoglobin A1c, Comprehensive metabolic panel  -Check labs, adjust plan accordingly, continue diet/exercise approach.  Need for pneumococcal vaccination - Plan: Pneumococcal conjugate vaccine 20-valent (Prevnar 20)  -Agreed to pneumonia vaccine based on age recommendations as well as previous pneumonia, prevnar 20 given.   Meds ordered this encounter  Medications   atorvastatin (LIPITOR) 20 MG tablet    Sig: Take 1 tablet (20 mg total) by mouth daily.    Dispense:  90 tablet    Refill:  1   Patient Instructions  I would like to see cholesterol lower - LDL under 70 at least, especially with seeing the calcification of the coronary arteries on recent scan. Depending on labs today, I would Increase the lipitor to 20mg  for now. I sent that to your pharmacy. Recheck labs in 6 weeks. Lab visit here in 6 weeks. 6 month  appointment with me.   Pneumonia vaccine today.   Thanks for coming in today.   Preventive Care 56 Years and Older, Male Preventive care refers to lifestyle choices and visits with your health care provider that can promote health and wellness. Preventive care visits are also called  wellness exams. What can I expect for my preventive care visit? Counseling During your preventive care visit, your health care provider may ask about your: Medical history, including: Past medical problems. Family medical history. History of falls. Current health, including: Emotional well-being. Home life and relationship well-being. Sexual activity. Memory and ability to understand (cognition). Lifestyle, including: Alcohol, nicotine or tobacco, and drug use. Access to firearms. Diet, exercise, and sleep habits. Work and work Astronomer. Sunscreen use. Safety issues such as seatbelt and bike helmet use. Physical exam Your health care provider will check your: Height and weight. These may be used to calculate your BMI (body mass index). BMI is a measurement that tells if you are at a healthy weight. Waist circumference. This measures the distance around your waistline. This measurement also tells if you are at a healthy weight and may help predict your risk of certain diseases, such as type 2 diabetes and high blood pressure. Heart rate and blood pressure. Body temperature. Skin for abnormal spots. What immunizations do I need?  Vaccines are usually given at various ages, according to a schedule. Your health care provider will recommend vaccines for you based on your age, medical history, and lifestyle or other factors, such as travel or where you work. What tests do I need? Screening Your health care provider may recommend screening tests for certain conditions. This may include: Lipid and cholesterol levels. Diabetes screening. This is done by checking your blood sugar (glucose) after you have not eaten for a while (fasting). Hepatitis C test. Hepatitis B test. HIV (human immunodeficiency virus) test. STI (sexually transmitted infection) testing, if you are at risk. Lung cancer screening. Colorectal cancer screening. Prostate cancer screening. Abdominal aortic aneurysm  (AAA) screening. You may need this if you are a current or former smoker. Talk with your health care provider about your test results, treatment options, and if necessary, the need for more tests. Follow these instructions at home: Eating and drinking  Eat a diet that includes fresh fruits and vegetables, whole grains, lean protein, and low-fat dairy products. Limit your intake of foods with high amounts of sugar, saturated fats, and salt. Take vitamin and mineral supplements as recommended by your health care provider. Do not drink alcohol if your health care provider tells you not to drink. If you drink alcohol: Limit how much you have to 0-2 drinks a day. Know how much alcohol is in your drink. In the [place] one drink equals one 12 oz bottle of beer (355 mL), one 5 oz glass of wine (148 mL), or one 1 oz glass of hard liquor (44 mL). Lifestyle Brush your teeth every morning and night with fluoride toothpaste. Floss one time each day. Exercise for at least 30 minutes 5 or more days each week. Do not use any products that contain nicotine or tobacco. These products include cigarettes, chewing tobacco, and vaping devices, such as e-cigarettes. If you need help quitting, ask your health care provider. Do not use drugs. If you are sexually active, practice safe sex. Use a condom or other form of protection to prevent STIs. Take aspirin only as told by your health care provider. Make sure that you understand how much to take  and what form to take. Work with your health care provider to find out whether it is safe and beneficial for you to take aspirin daily. Ask your health care provider if you need to take a cholesterol-lowering medicine (statin). Find healthy ways to manage stress, such as: Meditation, yoga, or listening to music. Journaling. Talking to a trusted person. Spending time with friends and family. Safety Always wear your seat belt while driving or riding in a vehicle. Do not  drive: If you have been drinking alcohol. Do not ride with someone who has been drinking. When you are tired or distracted. While texting. If you have been using any mind-altering substances or drugs. Wear a helmet and other protective equipment during sports activities. If you have firearms in your house, make sure you follow all gun safety procedures. Minimize exposure to UV radiation to reduce your risk of skin cancer. What's next? Visit your health care provider once a year for an annual wellness visit. Ask your health care provider how often you should have your eyes and teeth checked. Stay up to date on all vaccines. This information is not intended to replace advice given to you by your health care provider. Make sure you discuss any questions you have with your health care provider. Document Revised: 05/15/2021 Document Reviewed: 05/15/2021 Elsevier Patient Education  2024 Elsevier Inc.     Signed,   Meredith Staggers, MD [facility], [facility] 02/22/24 5:04 PM

## 2024-02-22 NOTE — Patient Instructions (Addendum)
 I would like to see cholesterol lower - LDL under 70 at least, especially with seeing the calcification of the coronary arteries on recent scan. Depending on labs today, I would Increase the lipitor to 20mg  for now. I sent that to your pharmacy. Recheck labs in 6 weeks. Lab visit here in 6 weeks. 6 month appointment with me.   Pneumonia vaccine today.   Thanks for coming in today.   Preventive Care 14 Years and Older, Male Preventive care refers to lifestyle choices and visits with your health care provider that can promote health and wellness. Preventive care visits are also called wellness exams. What can I expect for my preventive care visit? Counseling During your preventive care visit, your health care provider may ask about your: Medical history, including: Past medical problems. Family medical history. History of falls. Current health, including: Emotional well-being. Home life and relationship well-being. Sexual activity. Memory and ability to understand (cognition). Lifestyle, including: Alcohol, nicotine or tobacco, and drug use. Access to firearms. Diet, exercise, and sleep habits. Work and work Astronomer. Sunscreen use. Safety issues such as seatbelt and bike helmet use. Physical exam Your health care provider will check your: Height and weight. These may be used to calculate your BMI (body mass index). BMI is a measurement that tells if you are at a healthy weight. Waist circumference. This measures the distance around your waistline. This measurement also tells if you are at a healthy weight and may help predict your risk of certain diseases, such as type 2 diabetes and high blood pressure. Heart rate and blood pressure. Body temperature. Skin for abnormal spots. What immunizations do I need?  Vaccines are usually given at various ages, according to a schedule. Your health care provider will recommend vaccines for you based on your age, medical history, and  lifestyle or other factors, such as travel or where you work. What tests do I need? Screening Your health care provider may recommend screening tests for certain conditions. This may include: Lipid and cholesterol levels. Diabetes screening. This is done by checking your blood sugar (glucose) after you have not eaten for a while (fasting). Hepatitis C test. Hepatitis B test. HIV (human immunodeficiency virus) test. STI (sexually transmitted infection) testing, if you are at risk. Lung cancer screening. Colorectal cancer screening. Prostate cancer screening. Abdominal aortic aneurysm (AAA) screening. You may need this if you are a current or former smoker. Talk with your health care provider about your test results, treatment options, and if necessary, the need for more tests. Follow these instructions at home: Eating and drinking  Eat a diet that includes fresh fruits and vegetables, whole grains, lean protein, and low-fat dairy products. Limit your intake of foods with high amounts of sugar, saturated fats, and salt. Take vitamin and mineral supplements as recommended by your health care provider. Do not drink alcohol if your health care provider tells you not to drink. If you drink alcohol: Limit how much you have to 0-2 drinks a day. Know how much alcohol is in your drink. In the [place] one drink equals one 12 oz bottle of beer (355 mL), one 5 oz glass of wine (148 mL), or one 1 oz glass of hard liquor (44 mL). Lifestyle Brush your teeth every morning and night with fluoride toothpaste. Floss one time each day. Exercise for at least 30 minutes 5 or more days each week. Do not use any products that contain nicotine or tobacco. These products include cigarettes, chewing tobacco, and vaping  devices, such as e-cigarettes. If you need help quitting, ask your health care provider. Do not use drugs. If you are sexually active, practice safe sex. Use a condom or other form of protection to  prevent STIs. Take aspirin only as told by your health care provider. Make sure that you understand how much to take and what form to take. Work with your health care provider to find out whether it is safe and beneficial for you to take aspirin daily. Ask your health care provider if you need to take a cholesterol-lowering medicine (statin). Find healthy ways to manage stress, such as: Meditation, yoga, or listening to music. Journaling. Talking to a trusted person. Spending time with friends and family. Safety Always wear your seat belt while driving or riding in a vehicle. Do not drive: If you have been drinking alcohol. Do not ride with someone who has been drinking. When you are tired or distracted. While texting. If you have been using any mind-altering substances or drugs. Wear a helmet and other protective equipment during sports activities. If you have firearms in your house, make sure you follow all gun safety procedures. Minimize exposure to UV radiation to reduce your risk of skin cancer. What's next? Visit your health care provider once a year for an annual wellness visit. Ask your health care provider how often you should have your eyes and teeth checked. Stay up to date on all vaccines. This information is not intended to replace advice given to you by your health care provider. Make sure you discuss any questions you have with your health care provider. Document Revised: 05/15/2021 Document Reviewed: 05/15/2021 Elsevier Patient Education  2024 [employer].

## 2024-02-23 ENCOUNTER — Encounter: Payer: Self-pay | Admitting: Family Medicine

## 2024-02-23 LAB — COMPREHENSIVE METABOLIC PANEL
ALT: 19 U/L (ref 0–53)
AST: 19 U/L (ref 0–37)
Albumin: 4.6 g/dL (ref 3.5–5.2)
Alkaline Phosphatase: 75 U/L (ref 39–117)
BUN: 22 mg/dL (ref 6–23)
CO2: 30 meq/L (ref 19–32)
Calcium: 9.6 mg/dL (ref 8.4–10.5)
Chloride: 102 meq/L (ref 96–112)
Creatinine, Ser: 0.91 mg/dL (ref 0.40–1.50)
GFR: 81.57 mL/min (ref >60.00)
Glucose, Bld: 97 mg/dL (ref 70–99)
Potassium: 4.7 meq/L (ref 3.5–5.1)
Sodium: 138 meq/L (ref 135–145)
Total Bilirubin: 0.9 mg/dL (ref 0.2–1.2)
Total Protein: 7.3 g/dL (ref 6.0–8.3)

## 2024-02-23 LAB — LIPID PANEL
Cholesterol: 184 mg/dL (ref 0–200)
HDL: 61.3 mg/dL (ref >39.00)
LDL Cholesterol: 106 mg/dL — ABNORMAL HIGH (ref 0–99)
NonHDL: 122.59
Total CHOL/HDL Ratio: 3
Triglycerides: 81 mg/dL (ref 0.0–149.0)
VLDL: 16.2 mg/dL (ref 0.0–40.0)

## 2024-02-23 LAB — HEMOGLOBIN A1C: Hgb A1c MFr Bld: 5.6 % (ref 4.6–6.5)

## 2024-02-24 ENCOUNTER — Ambulatory Visit (INDEPENDENT_AMBULATORY_CARE_PROVIDER_SITE_OTHER): Payer: Federal, State, Local not specified - PPO | Admitting: Pulmonary Disease

## 2024-02-24 ENCOUNTER — Encounter: Payer: Self-pay | Admitting: Pulmonary Disease

## 2024-02-24 VITALS — BP 149/85 | HR 57 | Temp 97.3°F | Ht 69.25 in | Wt 189.0 lb

## 2024-02-24 DIAGNOSIS — R9389 Abnormal findings on diagnostic imaging of other specified body structures: Secondary | ICD-10-CM | POA: Diagnosis not present

## 2024-02-24 NOTE — Progress Notes (Signed)
 Austin Watkins    [id number]    05/27/1946  Primary Care Physician:Greene, Asencion Partridge, MD  Referring Physician: Shade Flood, MD [address] [place],  [place] [address]  Chief complaint:   Patient being seen for abnormal CT scan/x-ray  HPI:  In for follow-up today  Had a repeat CT scan Most recent CT shows significant improvement in atelectatic area Tiny chronic nodules that are calcified  No significant symptoms  No cough, no weight loss, no night sweats Never smoker  No pertinent occupational history May have done some woodworking when he was about 16-17, exposure to sawdust    Outpatient Encounter Medications as of 02/24/2024  Medication Sig   aspirin 81 MG tablet Take 81 mg by mouth daily.   atorvastatin (LIPITOR) 20 MG tablet Take 1 tablet (20 mg total) by mouth daily.   calcium carbonate (TUMS - DOSED IN MG ELEMENTAL CALCIUM) 500 MG chewable tablet Chew 1 tablet by mouth daily.   Multiple Vitamins-Minerals (MULTIVITAMIN ADULT) TABS Take by mouth.   No facility-administered encounter medications on file as of 02/24/2024.    Allergies as of 02/24/2024 - Review Complete 02/24/2024  Allergen Reaction Noted   Neosporin [neomycin-bacitracin zn-polymyx] Other (See Comments) 03/28/2013    Past Medical History:  Diagnosis Date   Allergy    Cataract    Hyperlipidemia     Past Surgical History:  Procedure Laterality Date   knee scoped      Family History  Problem Relation Age of Onset   Heart disease Mother    Thyroid disease Mother    Hyperlipidemia Mother    Heart disease Father    Stroke Father    Diabetes Brother     Social History   Socioeconomic History   Marital status: Married    Spouse name: Not on file   Number of children: 2   Years of education: Not on file   Highest education level: Master's degree (e.g., MA, MS, MEng, MEd, MSW, MBA)  Occupational History   Occupation: Retired  Tobacco Use   Smoking status: Never    Smokeless tobacco: Never  Vaping Use   Vaping status: Never Used  Substance and Sexual Activity   Alcohol use: No   Drug use: No   Sexual activity: Yes  Other Topics Concern   Not on file  Social History Narrative   Married   Social Drivers of Health   Financial Resource Strain: Low Risk  (02/19/2024)   Overall Financial Resource Strain (CARDIA)    Difficulty of Paying Living Expenses: Not hard at all  Food Insecurity: No Food Insecurity (02/19/2024)   Hunger Vital Sign    Worried About Running Out of Food in the Last Year: Never true    Ran Out of Food in the Last Year: Never true  Transportation Needs: No Transportation Needs (02/19/2024)   PRAPARE - Administrator, Civil Service (Medical): No    Lack of Transportation (Non-Medical): No  Physical Activity: Sufficiently Active (02/19/2024)   Exercise Vital Sign    Days of Exercise per Week: 7 days    Minutes of Exercise per Session: 60 min  Stress: No Stress Concern Present (02/19/2024)   [employer] of Occupational Health - Occupational Stress Questionnaire    Feeling of Stress : Not at all  Social Connections: Moderately Integrated (02/19/2024)   Social Connection and Isolation Panel [NHANES]    Frequency of Communication with  Friends and Family: Three times a week    Frequency of Social Gatherings with Friends and Family: Once a week    Attends Religious Services: More than 4 times per year    Active Member of [employer] or Organizations: No    Attends Engineer, structural: Not on file    Marital Status: Married  Catering manager Violence: Not on file    Review of Systems  Respiratory:  Negative for cough and shortness of breath.     Vitals:   02/24/24 1110  BP: (!) 149/85  Pulse: (!) 57  Temp: (!) 97.3 F (36.3 C)  SpO2: 97%     Physical Exam Constitutional:      Appearance: Normal appearance.  HENT:     Head: Normocephalic.     Mouth/Throat:     Mouth: Mucous membranes are moist.   Eyes:     General: No scleral icterus. Cardiovascular:     Rate and Rhythm: Regular rhythm.     Heart sounds: No murmur heard.    No friction rub.  Pulmonary:     Effort: No respiratory distress.     Breath sounds: No stridor. No wheezing or rhonchi.  Musculoskeletal:     Cervical back: No rigidity or tenderness.  Neurological:     Mental Status: He is alert.  Psychiatric:        Mood and Affect: Mood normal.     Data Reviewed: Chest x-ray is reviewed  CT scan of the chest from December reviewed with the patient CT scan 11/10/2023 and 02/09/2024 were reviewed  Assessment:  Recent pneumonia -Remains asymptomatic  Abnormal CT scan of the chest showing atelectasis -This has significantly improved from last CT  Low risk for malignancy  Plan/Recommendations:  Will repeat CT a year from now  Encouraged to call with significant concerns  Encouraged to stay active, graded activities as tolerated  Virl Diamond MD [facility] 02/24/2024, 11:20 AM  CC: Shade Flood, MD

## 2024-02-24 NOTE — Patient Instructions (Signed)
 CAT scan shows significant improvement-there may be scarring in the area of interest long-term  The risk of this being anything is very very very low -There is no cancer that we know-medicine that we will get better without treatment  I will see you a year from now  Continue to try to stay active  I have placed a request for a repeat CT before the visit in a year

## 2024-03-17 ENCOUNTER — Other Ambulatory Visit

## 2024-04-04 ENCOUNTER — Other Ambulatory Visit (INDEPENDENT_AMBULATORY_CARE_PROVIDER_SITE_OTHER)

## 2024-04-04 DIAGNOSIS — E785 Hyperlipidemia, unspecified: Secondary | ICD-10-CM

## 2024-04-04 LAB — COMPREHENSIVE METABOLIC PANEL WITH GFR
ALT: 17 U/L (ref 0–53)
AST: 19 U/L (ref 0–37)
Albumin: 4.5 g/dL (ref 3.5–5.2)
Alkaline Phosphatase: 69 U/L (ref 39–117)
BUN: 21 mg/dL (ref 6–23)
CO2: 31 meq/L (ref 19–32)
Calcium: 9.7 mg/dL (ref 8.4–10.5)
Chloride: 105 meq/L (ref 96–112)
Creatinine, Ser: 0.94 mg/dL (ref 0.40–1.50)
GFR: 78.39 mL/min (ref >60.00)
Glucose, Bld: 94 mg/dL (ref 70–99)
Potassium: 5.2 meq/L — ABNORMAL HIGH (ref 3.5–5.1)
Sodium: 142 meq/L (ref 135–145)
Total Bilirubin: 0.8 mg/dL (ref 0.2–1.2)
Total Protein: 7.4 g/dL (ref 6.0–8.3)

## 2024-04-04 LAB — LIPID PANEL
Cholesterol: 162 mg/dL (ref 0–200)
HDL: 61.9 mg/dL (ref >39.00)
LDL Cholesterol: 89 mg/dL (ref 0–99)
NonHDL: 100.39
Total CHOL/HDL Ratio: 3
Triglycerides: 59 mg/dL (ref 0.0–149.0)
VLDL: 11.8 mg/dL (ref 0.0–40.0)

## 2024-04-05 ENCOUNTER — Encounter: Payer: Self-pay | Admitting: Family Medicine

## 2024-04-06 ENCOUNTER — Telehealth: Payer: Self-pay

## 2024-04-06 DIAGNOSIS — E875 Hyperkalemia: Secondary | ICD-10-CM

## 2024-04-06 NOTE — Telephone Encounter (Signed)
-----   Message from Benjiman Bras sent at 04/05/2024  3:27 PM EDT ----- Results sent by MyChart, schedule lab visit for hyperkalemia, check BMP in the next 2 weeks.

## 2024-04-07 NOTE — Addendum Note (Signed)
 Addended by: Chinwe Lope K on: 04/07/2024 11:15 AM   Modules accepted: Orders

## 2024-04-07 NOTE — Telephone Encounter (Signed)
 Noted.  Let's plan on repeating labs to verify accuracy of initial test.  If any new symptoms or questions let me know.

## 2024-04-07 NOTE — Telephone Encounter (Signed)
 Lab results have been discussed.   Verbalized understanding? Yes  Are there any questions? No   Patient is scheduled on 04/20/2024   Patient states he is taking a multivitamin and they checked to make sure there is no potassium. Patient does eat foods with potassium. Does not have a lot of water intake.

## 2024-04-07 NOTE — Telephone Encounter (Addendum)
 Called patient to check if he had questions and to get lab only appt for 2 weeks. BMP lab for repeat is ordered as future.   Lab result reviewed by Patient:  Seen by patient Audree Leas on 04/06/2024  2:23 PM    Cholesterol levels were normal.  Potassium level was slightly high at 5.2.  If you are taking any potassium supplements, cut back on those and lets recheck the levels in the next few weeks with a lab only visit.  I will have the staff call you to schedule that test.  Let me know if there are questions.   Dr. Ester Helms

## 2024-04-07 NOTE — Telephone Encounter (Signed)
 Copied from CRM [id number]. Topic: General - Other >> Apr 07, 2024 11:42 AM Caliyah H wrote: Reason for CRM: Patient returned a missed call from the provider's nurse earlier today.

## 2024-04-08 NOTE — Telephone Encounter (Signed)
 Called and LM to have patient call back

## 2024-04-11 NOTE — Telephone Encounter (Signed)
 Pt is scheduled labs ordered for future

## 2024-04-11 NOTE — Telephone Encounter (Signed)
 Called patient to discuss follow up recommend by Dr Ester Helms, no answer, LM to call back

## 2024-04-20 ENCOUNTER — Other Ambulatory Visit (INDEPENDENT_AMBULATORY_CARE_PROVIDER_SITE_OTHER)

## 2024-04-20 DIAGNOSIS — E875 Hyperkalemia: Secondary | ICD-10-CM

## 2024-04-20 LAB — BASIC METABOLIC PANEL WITH GFR
BUN: 25 mg/dL — ABNORMAL HIGH (ref 6–23)
CO2: 28 meq/L (ref 19–32)
Calcium: 9.4 mg/dL (ref 8.4–10.5)
Chloride: 104 meq/L (ref 96–112)
Creatinine, Ser: 0.86 mg/dL (ref 0.40–1.50)
GFR: 83.7 mL/min (ref >60.00)
Glucose, Bld: 92 mg/dL (ref 70–99)
Potassium: 5.4 meq/L — ABNORMAL HIGH (ref 3.5–5.1)
Sodium: 139 meq/L (ref 135–145)

## 2024-04-26 ENCOUNTER — Ambulatory Visit: Payer: Self-pay | Admitting: Family Medicine

## 2024-04-27 NOTE — Telephone Encounter (Signed)
 Noted that he has gone on vacation.  Please call patient.  Okay to recheck labs and office visit after he returns.  Make sure he is drinking sufficient fluids and avoiding NSAIDs as those are 2 possible contributors to high potassium.  If he has any muscle weakness, heart palpitations or other new symptoms should be seen right away but I do not expect that at that level of elevated potassium.  Let me know if there are questions in the meantime.

## 2024-04-29 ENCOUNTER — Telehealth: Payer: Self-pay

## 2024-04-29 NOTE — Telephone Encounter (Signed)
 Copied from CRM [id number]. Topic: General - Other >> Apr 29, 2024  1:09 PM Howard Macho wrote: Reason for CRM: patient wife called stating she was checking on a message regarding making an appointment for the patient for abnormal lab results. Patient wife stated she could not get into the patient mychart. I read the mychart message to the wife because she was on the [facility]. The patient wife scheduled an appointment for 6/19 for the patient

## 2024-05-19 ENCOUNTER — Ambulatory Visit: Admitting: Family Medicine

## 2024-05-19 ENCOUNTER — Encounter: Payer: Self-pay | Admitting: Family Medicine

## 2024-05-19 VITALS — BP 122/80 | HR 58 | Temp 98.2°F | Ht 69.25 in | Wt 187.0 lb

## 2024-05-19 DIAGNOSIS — E875 Hyperkalemia: Secondary | ICD-10-CM

## 2024-05-19 DIAGNOSIS — E785 Hyperlipidemia, unspecified: Secondary | ICD-10-CM | POA: Diagnosis not present

## 2024-05-19 LAB — BASIC METABOLIC PANEL WITH GFR
BUN: 18 mg/dL (ref 6–23)
CO2: 31 meq/L (ref 19–32)
Calcium: 9.5 mg/dL (ref 8.4–10.5)
Chloride: 104 meq/L (ref 96–112)
Creatinine, Ser: 0.84 mg/dL (ref 0.40–1.50)
GFR: 84.25 mL/min (ref >60.00)
Glucose, Bld: 101 mg/dL — ABNORMAL HIGH (ref 70–99)
Potassium: 4.9 meq/L (ref 3.5–5.1)
Sodium: 139 meq/L (ref 135–145)

## 2024-05-19 NOTE — Progress Notes (Signed)
 Subjective:  Patient ID: Austin Watkins, male    DOB: [birth year]  Age: 78 y.o. MRN: [id number]  CC:  Chief Complaint  Patient presents with   Follow-up    Abnormal Results/ discuss about cholesterol and potassium     HPI NHIA HEAPHY presents for   Hyperkalemia Previous range of 4.7-4.9, noted elevation of 5.2 on 04/04/2024.  Repeat testing on 04/20/2024 was 5.4.  He is not on potassium supplement.  Current prescription medication of Lipitor  and aspirin, Tums, multivitamin.  Hyperlipidemia: Most recent lipid panel May 5, LDL improved at 89.  Continues on Lipitor . No new side effects.  Lab Results  Component Value Date   CHOL 162 04/04/2024   HDL 61.90 04/04/2024   LDLCALC 89 04/04/2024   TRIG 59.0 04/04/2024   CHOLHDL 3 04/04/2024   Lab Results  Component Value Date   ALT 17 04/04/2024   AST 19 04/04/2024   ALKPHOS 69 04/04/2024   BILITOT 0.8 04/04/2024       History Patient Active Problem List   Diagnosis Date Noted   Cellulitis of foot, right 04/25/2014   Closed fracture of fifth metatarsal bone of right foot 04/18/2014   Closed fracture of the right second, third, and fifth proximal phalanges of the foot 04/18/2014   Lipid disorder 03/29/2012   Allergy history unknown 03/08/2012   Bronchospasm 03/08/2012   Past Medical History:  Diagnosis Date   Allergy    Cataract    Hyperlipidemia    Past Surgical History:  Procedure Laterality Date   knee scoped     Allergies  Allergen Reactions   Neosporin [Neomycin-Bacitracin Zn-Polymyx] Other (See Comments)    Triple antibiotic - rash on skin   Prior to Admission medications   Medication Sig Start Date End Date Taking? Authorizing Provider  aspirin 81 MG tablet Take 81 mg by mouth daily.   Yes [provider]  atorvastatin  (LIPITOR ) 20 MG tablet Take 1 tablet (20 mg total) by mouth daily. 02/22/24  Yes Benjiman Bras, MD  calcium  carbonate (TUMS - DOSED IN MG ELEMENTAL CALCIUM ) 500 MG chewable  tablet Chew 1 tablet by mouth daily.   Yes [provider]  Multiple Vitamins-Minerals (MULTIVITAMIN ADULT) TABS Take by mouth.   Yes [provider]   Social History   Socioeconomic History   Marital status: Married    Spouse name: Not on file   Number of children: 2   Years of education: Not on file   Highest education level: Master's degree (e.g., MA, MS, MEng, MEd, MSW, MBA)  Occupational History   Occupation: Retired  Tobacco Use   Smoking status: Never   Smokeless tobacco: Never  Vaping Use   Vaping status: Never Used  Substance and Sexual Activity   Alcohol use: No   Drug use: No   Sexual activity: Yes  Other Topics Concern   Not on file  Social History Narrative   Married   Social Drivers of Health   Financial Resource Strain: Low Risk  (05/15/2024)   Overall Financial Resource Strain (CARDIA)    Difficulty of Paying Living Expenses: Not hard at all  Food Insecurity: No Food Insecurity (05/15/2024)   Hunger Vital Sign    Worried About Running Out of Food in the Last Year: Never true    Ran Out of Food in the Last Year: Never true  Transportation Needs: No Transportation Needs (05/15/2024)   PRAPARE - Administrator, Civil Service (Medical): No  Lack of Transportation (Non-Medical): No  Physical Activity: Unknown (05/15/2024)   Exercise Vital Sign    Days of Exercise per Week: 7 days    Minutes of Exercise per Session: Not on file  Stress: No Stress Concern Present (05/15/2024)   [employer] of Occupational Health - Occupational Stress Questionnaire    Feeling of Stress: Not at all  Social Connections: Moderately Isolated (05/15/2024)   Social Connection and Isolation Panel    Frequency of Communication with Friends and Family: Once a week    Frequency of Social Gatherings with Friends and Family: Once a week    Attends Religious Services: More than 4 times per year    Active Member of [employer] or Organizations: No    Attends  Engineer, structural: Not on file    Marital Status: Married  Catering manager Violence: Not on file    Review of Systems  Constitutional:  Negative for fatigue and unexpected weight change.  Eyes:  Negative for visual disturbance.  Respiratory:  Negative for cough, chest tightness and shortness of breath.   Cardiovascular:  Negative for chest pain, palpitations and leg swelling.  Gastrointestinal:  Negative for abdominal pain and blood in stool.  Neurological:  Negative for dizziness, light-headedness and headaches.  No mm cramping/spasm.    Objective:   Vitals:   05/19/24 0822 05/19/24 0915  BP: (!) 160/80 122/80  Pulse: (!) 58   Temp: 98.2 F (36.8 C)   TempSrc: Oral   SpO2: 98%   Weight: 187 lb (84.8 kg)   Height: 5' 9.25 (1.759 m)     Physical Exam Vitals reviewed.  Constitutional:      Appearance: He is well-developed.  HENT:     Head: Normocephalic and atraumatic.  Neck:     Vascular: No carotid bruit or JVD.   Cardiovascular:     Rate and Rhythm: Normal rate and regular rhythm.     Heart sounds: Normal heart sounds. No murmur heard. Pulmonary:     Effort: Pulmonary effort is normal.     Breath sounds: Normal breath sounds. No rales.   Musculoskeletal:     Right lower leg: No edema.     Left lower leg: No edema.   Skin:    General: Skin is warm and dry.   Neurological:     Mental Status: He is alert and oriented to person, place, and time.   Psychiatric:        Mood and Affect: Mood normal.        Assessment & Plan:  Austin Watkins is a 78 y.o. male . Serum potassium elevated - Plan: Basic metabolic panel with GFR  - Mild elevation, question pseudohyperkalemia, will try blood draw today without tourniquet, but advised [employer] that if tourniquet is needed, to initially place needle, then wait 1 to 2 minutes prior to blood draw and releasing tourniquet.  Not currently on potassium supplement.  Labs as above then adjust plan accordingly.   Asymptomatic.  Hyperlipidemia, unspecified hyperlipidemia type  - Improved on recent labs, continue Lipitor  same dose.  No orders of the defined types were placed in this encounter.  Patient Instructions  Cholesterol levels look better, continue same dose of Lipitor  at this time.  Can recheck levels in about 5 months.  Elevated potassium may be something called pseudohyperkalemia which is an elevated potassium on the blood specimen without truly having an elevated potassium in your circulation.  This sometimes occurs during blood draws and so we  have tried a different technique with blood draw today to see if that we will normalize that reading.  I will let you know once you review the results.  Take care!    Signed,   Caro Christmas, MD [facility], [facility] 05/19/24 9:18 AM

## 2024-05-19 NOTE — Patient Instructions (Signed)
 Cholesterol levels look better, continue same dose of Lipitor  at this time.  Can recheck levels in about 5 months.  Elevated potassium may be something called pseudohyperkalemia which is an elevated potassium on the blood specimen without truly having an elevated potassium in your circulation.  This sometimes occurs during blood draws and so we have tried a different technique with blood draw today to see if that we will normalize that reading.  I will let you know once you review the results.  Take care!

## 2024-05-22 ENCOUNTER — Ambulatory Visit: Payer: Self-pay | Admitting: Family Medicine

## 2024-08-24 ENCOUNTER — Other Ambulatory Visit (HOSPITAL_BASED_OUTPATIENT_CLINIC_OR_DEPARTMENT_OTHER): Payer: Self-pay

## 2024-08-24 ENCOUNTER — Ambulatory Visit: Admitting: Family Medicine

## 2024-08-24 MED ORDER — COMIRNATY 30 MCG/0.3ML IM SUSY
0.3000 mL | PREFILLED_SYRINGE | Freq: Once | INTRAMUSCULAR | 0 refills | Status: AC
  Filled 2024-08-24: qty 0.3, 1d supply, fill #0

## 2024-10-10 ENCOUNTER — Other Ambulatory Visit: Payer: Self-pay | Admitting: Family Medicine

## 2024-10-10 DIAGNOSIS — E785 Hyperlipidemia, unspecified: Secondary | ICD-10-CM

## 2024-10-19 ENCOUNTER — Encounter: Payer: Self-pay | Admitting: Family Medicine

## 2024-10-19 ENCOUNTER — Ambulatory Visit: Admitting: Family Medicine

## 2024-10-19 VITALS — BP 136/78 | HR 65 | Temp 98.3°F | Resp 12 | Ht 69.25 in | Wt 187.6 lb

## 2024-10-19 DIAGNOSIS — E875 Hyperkalemia: Secondary | ICD-10-CM | POA: Diagnosis not present

## 2024-10-19 DIAGNOSIS — Z87898 Personal history of other specified conditions: Secondary | ICD-10-CM

## 2024-10-19 DIAGNOSIS — Z1211 Encounter for screening for malignant neoplasm of colon: Secondary | ICD-10-CM | POA: Insufficient documentation

## 2024-10-19 DIAGNOSIS — E785 Hyperlipidemia, unspecified: Secondary | ICD-10-CM

## 2024-10-19 LAB — COMPREHENSIVE METABOLIC PANEL WITH GFR
ALT: 18 U/L (ref 0–53)
AST: 18 U/L (ref 0–37)
Albumin: 4.6 g/dL (ref 3.5–5.2)
Alkaline Phosphatase: 63 U/L (ref 39–117)
BUN: 24 mg/dL — ABNORMAL HIGH (ref 6–23)
CO2: 31 meq/L (ref 19–32)
Calcium: 9.7 mg/dL (ref 8.4–10.5)
Chloride: 104 meq/L (ref 96–112)
Creatinine, Ser: 1.06 mg/dL (ref 0.40–1.50)
GFR: 67.61 mL/min (ref >60.00)
Glucose, Bld: 96 mg/dL (ref 70–99)
Potassium: 5.5 meq/L — ABNORMAL HIGH (ref 3.5–5.1)
Sodium: 139 meq/L (ref 135–145)
Total Bilirubin: 0.8 mg/dL (ref 0.2–1.2)
Total Protein: 7.5 g/dL (ref 6.0–8.3)

## 2024-10-19 LAB — LIPID PANEL
Cholesterol: 156 mg/dL (ref 0–200)
HDL: 65.4 mg/dL (ref >39.00)
LDL Cholesterol: 77 mg/dL (ref 0–99)
NonHDL: 90.52
Total CHOL/HDL Ratio: 2
Triglycerides: 67 mg/dL (ref 0.0–149.0)
VLDL: 13.4 mg/dL (ref 0.0–40.0)

## 2024-10-19 NOTE — Patient Instructions (Signed)
 Thank you for coming in today. No change in medications at this time. If there are any concerns on your bloodwork, I will let you know. Take care!

## 2024-10-19 NOTE — Progress Notes (Signed)
 Subjective:  Patient ID: Austin Watkins, male    DOB: 02/07/1946  Age: 78 y.o. MRN: [id number]  CC:  Chief Complaint  Patient presents with   Follow-up    No questions or concerns.    Hyperlipidemia    HPI Austin Watkins presents for   Hyperlipidemia: Treated with Lipitor  20 mg daily, had been on 10mg  in past, but on 20mg  dose for last labs.   Denies any myalgias or side effects. No diet/exercise changes. Still walking 3 miles per day.  Lab Results  Component Value Date   CHOL 162 04/04/2024   HDL 61.90 04/04/2024   LDLCALC 89 04/04/2024   TRIG 59.0 04/04/2024   CHOLHDL 3 04/04/2024   Lab Results  Component Value Date   ALT 17 04/04/2024   AST 19 04/04/2024   ALKPHOS 69 04/04/2024   BILITOT 0.8 04/04/2024   Hyperkalemia Discussed in June, elevation of 5.2 and then 5.4 on repeat testing.  He was not on a potassium supplement at that time.Repeat testing improved with level 4.9 on June 19. No potassium supplements.   HM: Shingrix and tdap recommended at pharmacy - declines.    Prediabetes: History of prediabetes, normal range in March.  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/2018   Vitreous floater, bilateral 03/23/2018   Cellulitis of foot, right 04/25/2014   Closed fracture of fifth metatarsal bone of right foot 04/18/2014   Closed fracture of the right second, third, and fifth proximal phalanges of the foot 04/18/2014   Lipid disorder 03/29/2012   Allergy history unknown  03/08/2012   Bronchospasm 03/08/2012   Past Medical History:  Diagnosis Date   Allergy    Cataract    Hyperlipidemia    Past Surgical History:  Procedure Laterality Date   knee scoped     Allergies  Allergen Reactions   Neosporin [Neomycin-Bacitracin Zn-Polymyx] Other (See Comments)    Triple antibiotic - rash on skin   Prior to Admission medications   Medication Sig Start Date End Date Taking? Authorizing Provider  aspirin 81 MG tablet Take 81 mg by mouth daily.   Yes [provider]  atorvastatin  (LIPITOR ) 20 MG tablet TAKE 1 TABLET(20 MG) BY MOUTH DAILY 10/10/24  Yes Levora Reyes SAUNDERS, MD  calcium  carbonate (TUMS - DOSED IN MG ELEMENTAL CALCIUM ) 500 MG chewable tablet Chew 1 tablet by mouth daily.   Yes [provider]  Multiple Vitamins-Minerals (MULTIVITAMIN ADULT) TABS Take by mouth.    [provider]   Social History   Socioeconomic History   Marital status: Married    Spouse name: Not on file   Number of children: 2   Years of education: Not on file   Highest education level: Master's degree (e.g., MA, MS, MEng, MEd, MSW, MBA)  Occupational History   Occupation: Retired  Tobacco Use   Smoking status: Never   Smokeless tobacco: Never  Vaping Use   Vaping status: Never Used  Substance and Sexual Activity   Alcohol use: No   Drug use: No   Sexual activity: Yes  Other Topics Concern   Not on file  Social History Narrative   Married   Social Drivers of Health   Financial Resource Strain: Low Risk  (10/15/2024)   Overall Financial Resource Strain (CARDIA)    Difficulty of Paying Living Expenses: Not hard at all  Food Insecurity: No Food Insecurity (10/15/2024)   Hunger Vital Sign    Worried About Running Out of Food in the Last Year: Never true    Ran Out of Food in the Last Year: Never true  Transportation Needs: No Transportation Needs (10/15/2024)   PRAPARE - Administrator, Civil Service (Medical): No    Lack of  Transportation (Non-Medical): No  Physical Activity: Sufficiently Active (10/15/2024)   Exercise Vital Sign    Days of Exercise per Week: 7 days    Minutes of Exercise per Session: 60 min  Stress: No Stress Concern Present (10/15/2024)   [employer] of Occupational Health - Occupational Stress Questionnaire    Feeling of Stress: Not at all  Social Connections: Moderately Integrated (10/15/2024)   Social Connection and Isolation Panel    Frequency of Communication with Friends and Family: Twice a week    Frequency of Social Gatherings with Friends and Family: Once a week    Attends Religious Services: More than 4 times per year    Active Member of [employer] or Organizations: No    Attends Engineer, Structural: Not on file    Marital Status: Married  Catering Manager Violence: Not on file    Review of Systems  Constitutional:  Negative for fatigue and unexpected weight change.  Eyes:  Negative for visual disturbance.  Respiratory:  Negative for cough, chest tightness and shortness of breath.   Cardiovascular:  Negative for chest pain, palpitations and leg swelling.  Gastrointestinal:  Negative for abdominal pain and blood in stool.  Neurological:  Negative for dizziness, light-headedness and headaches.     Objective:   Vitals:   10/19/24 1030  BP: 136/78  Pulse: 65  Resp: 12  Temp: 98.3 F (36.8 C)  TempSrc: Temporal  SpO2: 99%  Weight: 187 lb 9.6 oz (85.1 kg)  Height: 5' 9.25 (1.759 m)     Physical Exam Vitals reviewed.  Constitutional:      Appearance: He is well-developed.  HENT:     Head: Normocephalic and atraumatic.  Neck:     Vascular: No carotid bruit or JVD.  Cardiovascular:     Rate and Rhythm: Normal rate and regular rhythm.     Heart sounds: Normal heart sounds. No murmur heard. Pulmonary:     Effort: Pulmonary effort is normal.     Breath sounds: Normal breath sounds. No rales.  Musculoskeletal:     Right lower leg: No edema.      Left lower leg: No edema.  Skin:    General: Skin is warm and dry.  Neurological:     Mental Status: He is alert and oriented to person, place, and time.  Psychiatric:        Mood and Affect: Mood normal.     Assessment & Plan:  Austin Watkins is a 78 y.o. male . Hyperlipidemia, unspecified hyperlipidemia type - Plan: Comprehensive metabolic panel with GFR, Lipid panel  - Tolerating current dose of Lipitor , higher dose since earlier this year.  Ideally would like to see LDL closer to  70, check labs and then consider further adjustments.  Serum potassium elevated - Plan: Comprehensive metabolic panel with GFR  - Prior elevated potassium, improved on most recent testing, recheck with labs today.  History of prediabetes - Plan: Hemoglobin A1c  - Last A1c in the normal range, check updated A1c, commended on exercise.  No orders of the defined types were placed in this encounter.  Patient Instructions  Thank you for coming in today. No change in medications at this time. If there are any concerns on your bloodwork, I will let you know. Take care!     Signed,   Reyes Pines, MD [facility], [facility] 10/19/24 11:07 AM

## 2024-10-20 LAB — HEMOGLOBIN A1C: Hgb A1c MFr Bld: 5.6 % (ref 4.6–6.5)

## 2024-10-23 ENCOUNTER — Ambulatory Visit: Payer: Self-pay | Admitting: Family Medicine

## 2024-10-24 ENCOUNTER — Other Ambulatory Visit: Payer: Self-pay

## 2024-10-24 DIAGNOSIS — E785 Hyperlipidemia, unspecified: Secondary | ICD-10-CM

## 2024-10-24 NOTE — Progress Notes (Signed)
 [facility] Lab ordered LVM to call office. Please schedule lab only visit when pt calls back

## 2024-10-25 NOTE — Progress Notes (Signed)
 LVM. Will send letter out

## 2024-10-25 NOTE — Progress Notes (Signed)
Called patient and left vm to return call.

## 2024-11-02 ENCOUNTER — Other Ambulatory Visit

## 2024-11-02 DIAGNOSIS — E785 Hyperlipidemia, unspecified: Secondary | ICD-10-CM

## 2024-11-02 LAB — BASIC METABOLIC PANEL WITH GFR
BUN: 31 mg/dL — ABNORMAL HIGH (ref 6–23)
CO2: 24 meq/L (ref 19–32)
Calcium: 9.4 mg/dL (ref 8.4–10.5)
Chloride: 104 meq/L (ref 96–112)
Creatinine, Ser: 0.95 mg/dL (ref 0.40–1.50)
GFR: 77.09 mL/min (ref >60.00)
Glucose, Bld: 86 mg/dL (ref 70–99)
Potassium: 4.1 meq/L (ref 3.5–5.1)
Sodium: 138 meq/L (ref 135–145)

## 2024-11-06 ENCOUNTER — Ambulatory Visit: Payer: Self-pay | Admitting: Family Medicine

## 2025-01-09 ENCOUNTER — Other Ambulatory Visit

## 2025-02-14 ENCOUNTER — Ambulatory Visit: Admitting: Pulmonary Disease

## 2025-04-19 ENCOUNTER — Encounter: Admitting: Family Medicine
